# Patient Record
Sex: Female | Born: 1965 | Hispanic: Yes | Marital: Married
Health system: Midwestern US, Community
[De-identification: ages and names within clinical notes are randomized; demographics above are authoritative.]

## PROBLEM LIST (undated history)

## (undated) DIAGNOSIS — D219 Benign neoplasm of connective and other soft tissue, unspecified: Secondary | ICD-10-CM

## (undated) DIAGNOSIS — D259 Leiomyoma of uterus, unspecified: Secondary | ICD-10-CM

---

## 2011-10-28 ENCOUNTER — Encounter

## 2011-10-29 LAB — EKG, 12 LEAD, INITIAL
Atrial Rate: 62 {beats}/min
Calculated P Axis: 14 degrees
Calculated R Axis: 11 degrees
Calculated T Axis: 21 degrees
Diagnosis: NORMAL
P-R Interval: 152 ms
Q-T Interval: 428 ms
QRS Duration: 92 ms
QTC Calculation (Bezet): 434 ms
Ventricular Rate: 62 {beats}/min

## 2011-10-30 LAB — METABOLIC PANEL, BASIC
Anion gap: 10 mmol/L (ref 3.0–18)
BUN/Creatinine ratio: 18 (ref 12–20)
BUN: 15 MG/DL (ref 7.0–18)
CO2: 27 MMOL/L (ref 21–32)
Calcium: 8.9 MG/DL (ref 8.5–10.1)
Chloride: 99 MMOL/L — ABNORMAL LOW (ref 100–108)
Creatinine: 0.83 MG/DL (ref 0.6–1.3)
GFR est AA: >60 mL/min/{1.73_m2} (ref >60)
GFR est non-AA: >60 mL/min/{1.73_m2} (ref >60)
Glucose: 106 MG/DL — ABNORMAL HIGH (ref 74–99)
Potassium: 3.3 MMOL/L — ABNORMAL LOW (ref 3.5–5.5)
Sodium: 136 MMOL/L (ref 136–145)

## 2011-10-30 LAB — CBC W/O DIFF
HCT: 35.1 % (ref 35.0–45.0)
HGB: 11.7 g/dL — ABNORMAL LOW (ref 12.0–16.0)
MCH: 27.8 PG (ref 24.0–34.0)
MCHC: 33.3 g/dL (ref 31.0–37.0)
MCV: 83.4 FL (ref 74.0–97.0)
MPV: 10.4 FL (ref 9.2–11.8)
PLATELET: 280 10*3/uL (ref 135–420)
RBC: 4.21 M/uL (ref 4.20–5.30)
RDW: 13.6 % (ref 11.6–14.5)
WBC: 7.7 10*3/uL (ref 4.6–13.2)

## 2011-10-31 NOTE — H&P (View-Only) (Signed)
[facility])  Requesting Physician:  Melvyn Neth, M.D.     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  She has a history of breast cancer diagnosed at age 43.  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  She ended up having bilateral mastectomy.  She says that she has had fibroids for years and becoming increasingly symptomatic in the past year.  She has had pain since January.  The pain is radiating into her left thigh and left hip.  She has extremely heavy menses and needs to bring a change of clothes to work when she is menstruating.  She has urinary frequency.  No change in bowel habits.  She saw Dr. Hardin Negus on April 19th regarding uterine artery embolization and he felt that the uterus and fibroids were simply too large and recommended hysterectomy.  Pap smear from 2011 was negative.  MRI from April 14th of this year reveals an overall uterine size of 12.6 x 11.0 x 20.  They describe several subserosal fibroids with the largest being exophytic at the right fundus, 9.3cm.  Ultrasound, 07/31/11, reveals a measured uterine length of 14.2cm with multiple fibroids again the largest being pedunculated off the right side at 8.2cm.         PAST MEDICAL HISTORY:     Breast cancer, as above.     PAST SURGICAL HISTORY:     1.  Bilateral mastectomy with TRAM-flap in 2009.  2.  2011 ??? Right elbow surgery.  3.  1994 ??? Cholecystectomy.     FAMILY HISTORY:     Negative for malignancy.     SOCIAL HISTORY:     The patient is single.  She has a 21 year old son.  She works as an Public house manager, as a Psychiatrist, and in a pain Regulatory affairs officer.  She is a nonsmoker with no alcohol intake.       REVIEW OF SYSTEMS:   CONSTITUTIONAL: No fever, chills, anorexia, weight loss, weakness, or sleep disturbance.  CARDIOVASCULAR: No chest pain, palpitations, syncope, or claudication.  RESPIRATORY:  No cough,  shortness of breath, hemoptysis, or orthopnea.  GI:  No nausea, vomiting, frequency or caliber of stools, blood in the stools, or diarrhea.  INTEGUMENTARY (skin, breasts): No breast pain, lumps, nipple discharge, or axillary lumps.  The patient notes no rash or skin irritation systemically.  ENDOCRINE:  No cold intolerance, excessive fatigue, or sleep disturbance.  HEM/LYMPH:  No anemia, easy bruising, history of bleeding disorders, or lymphedema.  GU:  No urinary frequency, dysuria, hematuria, or history of stones.  NEURO:  No numbness or focal weakness.  No tremors.  No problems with voiding or defecation.  PSYCHE:  No symptoms of depression or anxiety.  No hallucinations or symptoms of psychosis.     EXAMINATION:                      CONSTITUTIONAL:  The patient is in no acute distress.  She is alert and oriented times three.  EYES:  No lesions are present.  Pupils are equal.  The sclera are non-icteric.  NECK:  The neck is supple with a normal thyroid.  HEM/LYMPH:  There is no palpable lymphadenopathy in the cervical, supraclavicular, or inguinal regions.  RESPIRATORY:  The chest is clear to auscultation and percussion bilaterally.  CARDIOVASCULAR:  The heart is  regular and without murmur.    GASTROINTESTINAL: Surgical scarring is noted from abdominal reconstruction after TRAM-flap.  The abdomen is soft and nontender.  The uterus is palpable and extends to just below the level of the umbilicus.  It is reasonable mobile.    GENITOURINARY: The external genitalia, vagina, and cervix are without gross lesion.  Bimanual exam reveals an anterior fibroid just above the cervix.  The uterus is again reasonably mobile and extending just below the level of the umbilicus.     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  She has symptomatic uterine fibroids.  She has prior breast reconstruction with TRAM-flaps.  She is not felt to be a candidate for  uterine artery embolization.     PLAN:       I had a frank discussion with the patient regarding the current situation.  I am in agreement that total hysterectomy is in order.  Because of her breast cancer at such an early age, I strongly recommend that she have bilateral salpingo-oophorectomy as well.  After discussion, it seems she is not willing to agree to bilateral salpingo-oophorectomy at this time and I therefore, suggested that she have very close screening of the ovaries in the future.  I also explained that I cannot guarantee that both ovaries can be saved due to technical considerations, but given her wishes, we would make every effort to save one if not both ovaries.  I suggested that we have a reasonable chance of accomplishing the surgery by robotic minimally invasive technique, but that she must be prepared for the possible need for laparotomy either transverse or midline.  The details, risks, and postoperative recovery were thoroughly reviewed.  Questions were answered to her satisfaction, and we have made plans to proceed in June when it is best for her schedule.     cc:       Lawana Chambers. Alvester Morin, M.D.              Margy Clarks, M.D.              Charolotte Capuchin, M.D.                   ADDENDUM:     I also asked the patient to discuss with Dr. Rubye Oaks the possibility of low-dose estrogen replacement until the age of natural menopause if she should lose her ovaries.  This might help her in the decision making process.

## 2011-10-31 NOTE — H&P (Signed)
(Amended)  Requesting Physician:  Donald Bell, M.D.     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  She has a history of breast cancer diagnosed at age 40.  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  She ended up having bilateral mastectomy.  She says that she has had fibroids for years and becoming increasingly symptomatic in the past year.  She has had pain since January.  The pain is radiating into her left thigh and left hip.  She has extremely heavy menses and needs to bring a change of clothes to work when she is menstruating.  She has urinary frequency.  No change in bowel habits.  She saw Dr. Cockrrill on April 19th regarding uterine artery embolization and he felt that the uterus and fibroids were simply too large and recommended hysterectomy.  Pap smear from 2011 was negative.  MRI from April 14th of this year reveals an overall uterine size of 12.6 x 11.0 x 20.  They describe several subserosal fibroids with the largest being exophytic at the right fundus, 9.3cm.  Ultrasound, 07/31/11, reveals a measured uterine length of 14.2cm with multiple fibroids again the largest being pedunculated off the right side at 8.2cm.         PAST MEDICAL HISTORY:     Breast cancer, as above.     PAST SURGICAL HISTORY:     1.  Bilateral mastectomy with TRAM-flap in 2009.  2.  2011 ??? Right elbow surgery.  3.  1994 ??? Cholecystectomy.     FAMILY HISTORY:     Negative for malignancy.     SOCIAL HISTORY:     The patient is single.  She has a 21-year-old son.  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  She is a nonsmoker with no alcohol intake.       REVIEW OF SYSTEMS:   CONSTITUTIONAL: No fever, chills, anorexia, weight loss, weakness, or sleep disturbance.  CARDIOVASCULAR: No chest pain, palpitations, syncope, or claudication.  RESPIRATORY:  No cough,  shortness of breath, hemoptysis, or orthopnea.  GI:  No nausea, vomiting, frequency or caliber of stools, blood in the stools, or diarrhea.  INTEGUMENTARY (skin, breasts): No breast pain, lumps, nipple discharge, or axillary lumps.  The patient notes no rash or skin irritation systemically.  ENDOCRINE:  No cold intolerance, excessive fatigue, or sleep disturbance.  HEM/LYMPH:  No anemia, easy bruising, history of bleeding disorders, or lymphedema.  GU:  No urinary frequency, dysuria, hematuria, or history of stones.  NEURO:  No numbness or focal weakness.  No tremors.  No problems with voiding or defecation.  PSYCHE:  No symptoms of depression or anxiety.  No hallucinations or symptoms of psychosis.     EXAMINATION:                      CONSTITUTIONAL:  The patient is in no acute distress.  She is alert and oriented times three.  EYES:  No lesions are present.  Pupils are equal.  The sclera are non-icteric.  NECK:  The neck is supple with a normal thyroid.  HEM/LYMPH:  There is no palpable lymphadenopathy in the cervical, supraclavicular, or inguinal regions.  RESPIRATORY:  The chest is clear to auscultation and percussion bilaterally.  CARDIOVASCULAR:  The heart is   regular and without murmur.    GASTROINTESTINAL: Surgical scarring is noted from abdominal reconstruction after TRAM-flap.  The abdomen is soft and nontender.  The uterus is palpable and extends to just below the level of the umbilicus.  It is reasonable mobile.    GENITOURINARY: The external genitalia, vagina, and cervix are without gross lesion.  Bimanual exam reveals an anterior fibroid just above the cervix.  The uterus is again reasonably mobile and extending just below the level of the umbilicus.     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  She has symptomatic uterine fibroids.  She has prior breast reconstruction with TRAM-flaps.  She is not felt to be a candidate for  uterine artery embolization.     PLAN:       I had a frank discussion with the patient regarding the current situation.  I am in agreement that total hysterectomy is in order.  Because of her breast cancer at such an early age, I strongly recommend that she have bilateral salpingo-oophorectomy as well.  After discussion, it seems she is not willing to agree to bilateral salpingo-oophorectomy at this time and I therefore, suggested that she have very close screening of the ovaries in the future.  I also explained that I cannot guarantee that both ovaries can be saved due to technical considerations, but given her wishes, we would make every effort to save one if not both ovaries.  I suggested that we have a reasonable chance of accomplishing the surgery by robotic minimally invasive technique, but that she must be prepared for the possible need for laparotomy either transverse or midline.  The details, risks, and postoperative recovery were thoroughly reviewed.  Questions were answered to her satisfaction, and we have made plans to proceed in June when it is best for her schedule.     cc:       Donald B. Bell, M.D.              Elizabeth Lanoue, M.D.              Amy Skorupa, M.D.                   ADDENDUM:     I also asked the patient to discuss with Dr. Skorupa the possibility of low-dose estrogen replacement until the age of natural menopause if she should lose her ovaries.  This might help her in the decision making process.

## 2011-11-04 NOTE — Addendum Note (Signed)
Addended by: Vanessa Ralphs on: 11/04/2011 05:54 PM     Modules accepted: Orders

## 2011-11-06 ENCOUNTER — Inpatient Hospital Stay: Payer: PRIVATE HEALTH INSURANCE

## 2011-11-06 MED ADMIN — cefoTEtan (CEFOTAN) 2 g in 0.9% sodium chloride (MBP/ADV) 50 mL MBP: INTRAVENOUS | @ 12:00:00 | NDC 63323038620

## 2011-11-06 MED ADMIN — lactated ringers 1,000 mL Irrigation: @ 13:00:00 | NDC 00409795309

## 2011-11-06 MED ADMIN — morphine injection 1-2 mg: INTRAVENOUS | @ 18:00:00 | NDC 00409189001

## 2011-11-06 MED ADMIN — morphine injection 1-2 mg: INTRAVENOUS | @ 22:00:00 | NDC 00409189001

## 2011-11-06 MED ADMIN — sodium chloride (NS) flush 5-10 mL: INTRAVENOUS | @ 18:00:00 | NDC 87701099893

## 2011-11-06 MED ADMIN — morphine injection 1-2 mg: INTRAVENOUS | @ 20:00:00 | NDC 00409189001

## 2011-11-06 MED ADMIN — acetaminophen (OFIRMEV) infusion 1,000 mg: INTRAVENOUS | @ 11:00:00 | NDC 43825010201

## 2011-11-06 MED ADMIN — HYDROmorphone (DILAUDID) injection 0.5 mg: INTRAVENOUS | @ 15:00:00 | NDC 00641012121

## 2011-11-06 MED ADMIN — dextrose 5% - 0.45% NaCl with KCl 20 mEq/L infusion: INTRAVENOUS | @ 19:00:00 | NDC 00409790209

## 2011-11-06 MED ADMIN — famotidine (PEPCID) tablet 20 mg: ORAL | @ 11:00:00 | NDC 68084017211

## 2011-11-06 MED ADMIN — enoxaparin (LOVENOX) injection 40 mg: SUBCUTANEOUS | @ 16:00:00 | NDC 00075062040

## 2011-11-06 MED ADMIN — ketorolac (TORADOL) injection 30 mg: INTRAVENOUS | @ 17:00:00 | NDC 00409379501

## 2011-11-06 MED ADMIN — lactated ringers infusion: INTRAVENOUS | @ 11:00:00 | NDC 00409795309

## 2011-11-06 MED ADMIN — bupivacaine (PF) (MARCAINE) 0.5 % (5 mg/mL) injection: SUBCUTANEOUS | @ 12:00:00 | NDC 00409116202

## 2011-11-06 MED ADMIN — lactated ringers infusion: INTRAVENOUS | @ 17:00:00 | NDC 00409795309

## 2011-11-06 MED ADMIN — 0.9% sodium chloride 1,000 mL Irrigation: @ 13:00:00 | NDC 00338004904

## 2011-11-06 MED FILL — LACTATED RINGERS IV: INTRAVENOUS | Qty: 1000

## 2011-11-06 MED FILL — BD POSIFLUSH NORMAL SALINE 0.9 % INJECTION SYRINGE: INTRAMUSCULAR | Qty: 10

## 2011-11-06 MED FILL — MORPHINE 2 MG/ML INJECTION: 2 mg/mL | INTRAMUSCULAR | Qty: 1

## 2011-11-06 MED FILL — BUPIVACAINE (PF) 0.5 % (5 MG/ML) IJ SOLN: 0.5 % (5 mg/mL) | INTRAMUSCULAR | Qty: 30

## 2011-11-06 MED FILL — HYDROMORPHONE 2 MG/ML INJECTION SOLUTION: 2 mg/mL | INTRAMUSCULAR | Qty: 1

## 2011-11-06 MED FILL — FAMOTIDINE 20 MG TAB: 20 mg | ORAL | Qty: 1

## 2011-11-06 MED FILL — OFIRMEV 1,000 MG/100 ML (10 MG/ML) INTRAVENOUS SOLUTION: 1000 mg/100 mL (10 mg/mL) | INTRAVENOUS | Qty: 100

## 2011-11-06 MED FILL — CEFOTETAN 2 GRAM SOLUTION FOR INJECTION: 2 gram | INTRAMUSCULAR | Qty: 2

## 2011-11-06 MED FILL — D5-1/2 NS & POTASSIUM CHLORIDE 20 MEQ/L IV: 20 mEq/L | INTRAVENOUS | Qty: 1000

## 2011-11-06 MED FILL — LOVENOX 40 MG/0.4 ML SUBCUTANEOUS SYRINGE: 40 mg/0.4 mL | SUBCUTANEOUS | Qty: 0.4

## 2011-11-06 MED FILL — KETOROLAC TROMETHAMINE 30 MG/ML INJECTION: 30 mg/mL (1 mL) | INTRAMUSCULAR | Qty: 1

## 2011-11-06 MED FILL — SCOPOLAMINE (1.3-1.5) MG 72 HR TRANSDERM PATCH: 1 mg over 3 days | TRANSDERMAL | Qty: 1

## 2011-11-06 MED FILL — MIDAZOLAM 1 MG/ML IJ SOLN: 1 mg/mL | INTRAMUSCULAR | Qty: 2

## 2011-11-06 NOTE — Op Note (Signed)
OPERATION    DATE OF OPERATION:  11/06/2011      SURGEON: Brendalyn Vallely C Efrata Brunner, MD, MD    ASSISTANT: Roger Wright, SA      PREOPERATIVE DIAGNOSIS:  fibroids      POSTOPERATIVE DIAGNOSIS: fibroids, 24 cm uterus, 1200grams  .    OPERATION PERFORMED:  1. Robotic hysterectomy, >250 grams, excess difficulty secondary to massive uterine size.    INDICATIONS:  Amber Benson is a 45 y.o. old female admitted for surgery with a history of:   There are no active problems to display for this patient.  .  Recommendations were made for a definitive surgical therapy.  The patient was advised of potential risks and complications and written  consent was obtained.    ANESTHESIA: General.    COMPLICATIONS: none    EBL: 100    FINDINGS: 24cm fibroid uterus. Normal ovaries. No pathology in the upper abdomen.    DESCRIPTION OF OPERATION:     TECHNIQUE: After the patient was identified in the operating room, she was   placed under general anesthesia by the anesthesia team. She was sterilely   prepped and draped in the modified lithotomy position. The cervix and   uterus were secured with a VCare. An incision was made in the midline of  the upper abdomen and a 10 mm Optiview trocar was inserted. A   pneumoperitoneum was created. The 8 mm robotic ports were placed under   direct vision in the right and left upper abdomen and the left mid axillary line, and an accessory port   was placed in the right lower quadrant. The patient was placed in Trendelenburg position and the robot was docked. The ureters were identified. A 10 - 12 cm pedunculated fibroid was removed from the right fundus with bipolar cautery, and placed in the right para-colic gutter. The utero-ovarian anastomoses were skeletonized, coagulated, and divided. The broad ligaments were divided with   electrocautery. The round ligaments were coagulated and divided. The   bladder was sharply dissected from the cervix and upper vagina. The uterine   vessels were skeletonized,  thoroughly coagulated, and divided. The right uterine artery was also secured with an endoloop because of its size. The cardinal   ligaments were divided with electrocautery and electrocautery was used to   circumscribe the colpotomizer cup. The initial fibroid was removed through the RLQ site using a morcellator. The specimen was morcellated and removed through the   vagina. The vaginal   apex was closed with running 0 V-Loc. The pelvis was copiously irrigated   and hemostasis was noted to be excellent. There was absolutely no active   bleeding. The right lower quadrant site was closed with 0 Vicryl suture   using the Carter-Thomason device. The robotic instruments were removed   under direct vision and the robot was undocked. The pneumoperitoneum was   deflated, and the ports were removed. The skin incisions were closed with   subcuticular 4-0 Monocryl and Dermabond was applied. The patient was then   awakened, extubated, and transported to the recovery room in stable   condition. All needle, sponge, and instrument counts were noted to be   correct at the end of the case.        ______________________________  SIGNED: Jolyne Laye C Deloy Archey, MD, MD

## 2011-11-06 NOTE — Other (Signed)
Bedside and Verbal shift change report given to Mary Beth Acierto, RN (oncoming nurse) by Christina E Phillips, RN  (offgoing nurse).  Report given with SBAR, Kardex, MAR and Recent Results.

## 2011-11-06 NOTE — Other (Signed)
Pt. Awake and alert.  Denies nausea,  Tolerating ice chips.  VSS.   Repositioned for comfort.

## 2011-11-06 NOTE — Progress Notes (Signed)
Post-Anesthesia Evaluation and Assessment    Patient: Amber Benson MRN: [id number]  SSN: [id number]    Date of Birth: 30-Dec-1965  Age: 46 y.o.  Sex: female       Cardiovascular Function/Vital Signs  Visit Vitals   Item Reading   ??? BP 124/73   ??? Pulse 73   ??? Temp 98 ??F (36.7 ??C)   ??? Resp 14   ??? Ht 5\' 4"  (1.626 m)   ??? Wt 73.029 kg (161 lb)   ??? BMI 27.64 kg/m2   ??? SpO2 98%       Patient is status post General anesthesia for Procedure(s):  davinci total laparoscopic HYSTERECTOMY  with morcellator,possible exploratory laparotomy ROBOTIC ASSISTED .    Nausea/Vomiting: None    Postoperative hydration reviewed and adequate.    Pain:  Pain Scale 1: Numeric (0 - 10) (11/06/11 1246)  Pain Intensity 1: 8 (11/06/11 1246)   Managed    Neurological Status:   Neuro (WDL): Within Defined Limits (11/06/11 1308)   At baseline    Mental Status and Level of Consciousness: Alert and oriented to person, place, and time    Pulmonary Status:   O2 Device: Nasal cannula (11/06/11 1308)   Adequate oxygenation and airway patent    Complications related to anesthesia: None    Post-anesthesia assessment completed. No concerns    Signed By: Billy Fischer, MD     November 06, 2011

## 2011-11-06 NOTE — Brief Op Note (Signed)
BRIEF OPERATIVE NOTE    Date of Procedure: 11/06/2011   Preoperative Diagnosis: fibroids  Postoperative Diagnosis: fibroids    Procedure: Procedure(s):  davinci total laparoscopic HYSTERECTOMY  with morcellator,possible exploratory laparotomy ROBOTIC ASSISTED   Surgeon(s) and Role:     * Vanessa Ralphs, MD - Primary  Anesthesia: General   Estimated Blood Loss: 100  Specimens:   ID Type Source Tests Collected by Time Destination   1 : UTERUS AND CERVIX Preservative Uterus  Vanessa Ralphs, MD 11/06/2011 [phone] Pathology      Findings: 24cm fibroid uterus. Normal ovaries   Complications: none  Implants: * No implants in log *

## 2011-11-06 NOTE — Progress Notes (Addendum)
Assessed pt.  Resting quietly in bed.  Pt c/o abdominal pain; prn pain medication given.  Pt denies nausea.  Pt c/o of numbness in both hands; pt has strong hand grips.  Will continue to monitor numbness.  No s/s of acute distress.  Pt oriented to room.  Safety assessed; call bell within reach.  Spouse at the bedside.  Will continue to monitor pt.  Dell Ponto, RN     11/06/2011; 3:26 PM - Pt still c/o numbness in hands.  Some improvement noted.  Will continue to monitor.  Dell Ponto, RN     11/06/2011; 5:45 PM - Pt c/o numbness in hands; slowly/progressively improving.  Pt reports numbness only in the fingertips of the right hand; left hand still continues to be numb.  Will continue to monitor.  Dell Ponto, RN

## 2011-11-06 NOTE — Interval H&P Note (Signed)
H&P Update:  Camreigh Michie was seen and examined.  History and physical has been reviewed. There have been no significant clinical changes since the completion of the originally dated History and Physical. I have recommended BSO. Dr Rubye Oaks advised ovarian preservation and that is what Anyelina wants. We will preserve the ovaries if technically feasible    Signed By: Vanessa Ralphs, MD     November 06, 2011 7:19 AM

## 2011-11-06 NOTE — Other (Signed)
Sleeping.  Arouses easily to name called.  States abdominal pain 9/10.  Offered pain med but refuses.

## 2011-11-06 NOTE — Other (Signed)
Patient received from OR via stretcher. Supine. HOB up 20 degrees.  Spontaneous respirations with good effort.  VSS. Patient sleepy.  Arouses easily to name called.  Denies any pain.  Allergies reviewed with CRNA and chart.  No respiratory distress.

## 2011-11-06 NOTE — Other (Signed)
TRANSFER - OUT REPORT:    Verbal report given to Shireen Quan, RN(name) on Loreal Schuessler  being transferred to 2204(unit) for routine progression of care       Report consisted of patient???s Situation, Background, Assessment and   Recommendations(SBAR).     Information from the following report(s) SBAR, Kardex, OR Summary, Intake/Output, MAR and Recent Results was reviewed with the receiving nurse.    Opportunity for questions and clarification was provided.

## 2011-11-06 NOTE — Op Note (Signed)
OPERATION    DATE OF OPERATION:  11/06/2011      SURGEON: Vanessa Ralphs, MD, MD    ASSISTANT: Parthenia Ames, SA      PREOPERATIVE DIAGNOSIS:  fibroids      POSTOPERATIVE DIAGNOSIS: fibroids, 24 cm uterus, 1200grams  .    OPERATION PERFORMED:  1. Robotic hysterectomy, >250 grams, excess difficulty secondary to massive uterine size.    INDICATIONS:  Amber Benson is a 46 y.o. old female admitted for surgery with a history of:   There are no active problems to display for this patient.  .  Recommendations were made for a definitive surgical therapy.  The patient was advised of potential risks and complications and written  consent was obtained.    ANESTHESIA: General.    COMPLICATIONS: none    EBL: 100    FINDINGS: 24cm fibroid uterus. Normal ovaries. No pathology in the upper abdomen.    DESCRIPTION OF OPERATION:     TECHNIQUE: After the patient was identified in the operating room, she was   placed under general anesthesia by the anesthesia team. She was sterilely   prepped and draped in the modified lithotomy position. The cervix and   uterus were secured with a VCare. An incision was made in the midline of  the upper abdomen and a 10 mm Optiview trocar was inserted. A   pneumoperitoneum was created. The 8 mm robotic ports were placed under   direct vision in the right and left upper abdomen and the left mid axillary line, and an accessory port   was placed in the right lower quadrant. The patient was placed in Trendelenburg position and the robot was docked. The ureters were identified. A 10 - 12 cm pedunculated fibroid was removed from the right fundus with bipolar cautery, and placed in the right para-colic gutter. The utero-ovarian anastomoses were skeletonized, coagulated, and divided. The broad ligaments were divided with   electrocautery. The round ligaments were coagulated and divided. The   bladder was sharply dissected from the cervix and upper vagina. The uterine   vessels were skeletonized,  thoroughly coagulated, and divided. The right uterine artery was also secured with an endoloop because of its size. The cardinal   ligaments were divided with electrocautery and electrocautery was used to   circumscribe the colpotomizer cup. The initial fibroid was removed through the RLQ site using a morcellator. The specimen was morcellated and removed through the   vagina. The vaginal   apex was closed with running 0 V-Loc. The pelvis was copiously irrigated   and hemostasis was noted to be excellent. There was absolutely no active   bleeding. The right lower quadrant site was closed with 0 Vicryl suture   using the Carter-Thomason device. The robotic instruments were removed   under direct vision and the robot was undocked. The pneumoperitoneum was   deflated, and the ports were removed. The skin incisions were closed with   subcuticular 4-0 Monocryl and Dermabond was applied. The patient was then   awakened, extubated, and transported to the recovery room in stable   condition. All needle, sponge, and instrument counts were noted to be   correct at the end of the case.        ______________________________  SIGNED: Vanessa Ralphs, MD, MD

## 2011-11-07 LAB — CBC W/O DIFF
HCT: 32.2 % — ABNORMAL LOW (ref 35.0–45.0)
HGB: 11 g/dL — ABNORMAL LOW (ref 12.0–16.0)
MCH: 28.4 PG (ref 24.0–34.0)
MCHC: 34.2 g/dL (ref 31.0–37.0)
MCV: 83 FL (ref 74.0–97.0)
MPV: 10.2 FL (ref 9.2–11.8)
PLATELET: 225 10*3/uL (ref 135–420)
RBC: 3.88 M/uL — ABNORMAL LOW (ref 4.20–5.30)
RDW: 13.8 % (ref 11.6–14.5)
WBC: 13.7 10*3/uL — ABNORMAL HIGH (ref 4.6–13.2)

## 2011-11-07 LAB — METABOLIC PANEL, BASIC
Anion gap: 11 mmol/L (ref 3.0–18)
BUN/Creatinine ratio: 8 — ABNORMAL LOW (ref 12–20)
BUN: 7 MG/DL (ref 7.0–18)
CO2: 22 MMOL/L (ref 21–32)
Calcium: 7.8 MG/DL — ABNORMAL LOW (ref 8.5–10.1)
Chloride: 105 MMOL/L (ref 100–108)
Creatinine: 0.84 MG/DL (ref 0.6–1.3)
GFR est AA: >60 mL/min/{1.73_m2} (ref >60)
GFR est non-AA: >60 mL/min/{1.73_m2} (ref >60)
Glucose: 154 MG/DL — ABNORMAL HIGH (ref 74–99)
Potassium: 3.8 MMOL/L (ref 3.5–5.5)
Sodium: 138 MMOL/L (ref 136–145)

## 2011-11-07 MED ORDER — OXYCODONE-ACETAMINOPHEN 5 MG-325 MG TAB
5-325 mg | ORAL_TABLET | ORAL | Status: AC | PRN
Start: 2011-11-07 — End: ?

## 2011-11-07 MED ORDER — DOCUSATE SODIUM 100 MG CAP
100 mg | ORAL_CAPSULE | Freq: Two times a day (BID) | ORAL | Status: AC | PRN
Start: 2011-11-07 — End: 2011-11-21

## 2011-11-07 MED ORDER — PROMETHAZINE 25 MG TAB
25 mg | ORAL_TABLET | Freq: Four times a day (QID) | ORAL | Status: AC | PRN
Start: 2011-11-07 — End: ?

## 2011-11-07 MED ADMIN — ondansetron (ZOFRAN) injection 4 mg: INTRAVENOUS | @ 01:00:00 | NDC 00781301072

## 2011-11-07 MED ADMIN — morphine injection 1-2 mg: INTRAVENOUS | @ 01:00:00 | NDC 00409189001

## 2011-11-07 MED ADMIN — oxyCODONE-acetaminophen (PERCOCET) 5-325 mg per tablet 1-2 Tab: ORAL | @ 16:00:00 | NDC 00406051223

## 2011-11-07 MED ADMIN — dextrose 5% - 0.45% NaCl with KCl 20 mEq/L infusion: INTRAVENOUS | @ 10:00:00 | NDC 00409790209

## 2011-11-07 MED ADMIN — morphine injection 1-2 mg: INTRAVENOUS | @ 08:00:00 | NDC 00409189001

## 2011-11-07 MED ADMIN — enoxaparin (LOVENOX) injection 40 mg: SUBCUTANEOUS | @ 16:00:00 | NDC 00075062040

## 2011-11-07 MED ADMIN — promethazine (PHENERGAN) injection 12.5 mg: INTRAVENOUS | @ 01:00:00 | NDC 00641092821

## 2011-11-07 MED ADMIN — sodium chloride (NS) flush 5-10 mL: INTRAVENOUS | @ 03:00:00 | NDC 87701099893

## 2011-11-07 MED ADMIN — morphine injection 1-2 mg: INTRAVENOUS | @ 03:00:00 | NDC 00409189001

## 2011-11-07 MED ADMIN — dextrose 5% - 0.45% NaCl with KCl 20 mEq/L infusion: INTRAVENOUS | @ 03:00:00 | NDC 00409790209

## 2011-11-07 MED ADMIN — oxyCODONE-acetaminophen (PERCOCET) 5-325 mg per tablet 1-2 Tab: ORAL | @ 12:00:00 | NDC 00406051223

## 2011-11-07 MED FILL — MORPHINE 2 MG/ML INJECTION: 2 mg/mL | INTRAMUSCULAR | Qty: 1

## 2011-11-07 MED FILL — D5-1/2 NS & POTASSIUM CHLORIDE 20 MEQ/L IV: 20 mEq/L | INTRAVENOUS | Qty: 1000

## 2011-11-07 MED FILL — OXYCODONE-ACETAMINOPHEN 5 MG-325 MG TAB: 5-325 mg | ORAL | Qty: 2

## 2011-11-07 MED FILL — PROMETHAZINE 25 MG/ML INJECTION: 25 mg/mL | INTRAMUSCULAR | Qty: 1

## 2011-11-07 MED FILL — VECURONIUM BROMIDE 10 MG IV SOLR: 10 mg | INTRAVENOUS | Qty: 1

## 2011-11-07 MED FILL — NEOSTIGMINE METHYLSULFATE 1 MG/ML INJECTION: 1 mg/mL | INTRAMUSCULAR | Qty: 10

## 2011-11-07 MED FILL — LIDOCAINE 2 % MUCOSAL GEL: 2 % | Qty: 5

## 2011-11-07 MED FILL — LOVENOX 40 MG/0.4 ML SUBCUTANEOUS SYRINGE: 40 mg/0.4 mL | SUBCUTANEOUS | Qty: 0.4

## 2011-11-07 MED FILL — CETIRIZINE 10 MG TAB: 10 mg | ORAL | Qty: 1

## 2011-11-07 MED FILL — ONDANSETRON (PF) 4 MG/2 ML INJECTION: 4 mg/2 mL | INTRAMUSCULAR | Qty: 2

## 2011-11-07 MED FILL — GLYCOPYRROLATE 0.2 MG/ML IJ SOLN: 0.2 mg/mL | INTRAMUSCULAR | Qty: 2

## 2011-11-07 MED FILL — DIPRIVAN 10 MG/ML INTRAVENOUS EMULSION: 10 mg/mL | INTRAVENOUS | Qty: 20

## 2011-11-07 NOTE — Progress Notes (Signed)
Location: [facility] - [id number]  Attn.: Norris Cross C  DOB: August 20, 1965 / Age: 46  MR#: [id number] / Admit#: [id number]  Pt. First Name: Amber  Pt. Last Name: Benson Benson [place], [place]  [address]  [address]  [facility]                        Case Management - Progress Note  Initial Open Date: 11/07/2011  Case Manager: Leana Gamer    Initial Open Date:  Social Worker:  Expected Date of Discharge:  Transferred From:  EC[facility] Bed Held Until:  Bed Held By:  Power of Attorney:  POA/Guardian/Conservator Capacity:  Primary Caregiver:  Living Arrangements: Own Home  Source of Income:  Payee:  Psychosocial History:  Cultural/Religious/Language Issues:  Education Level:  ADLS/Current Living Arrangements Issues:  Past Providers:  Will patient perform self care at discharge? U  Anticipated Discharge Disposition Goal: Return to admission address  Assessment/Plan:      11/07/2011 10:37A In room and spoke with patient. Plan is  home with fiance at discharge. Fiance will transport and assist.  JClerveau,RN  Resources at Discharge:  Service Providers at Discharge:  Dictating Provider:

## 2011-11-07 NOTE — Progress Notes (Signed)
Chaplain conducted an initial consultation and Spiritual Assessment for Amber Benson, who is a 46 y.o.,female. Patient???s Primary Language is: [place].   According to the patient???s EMR Religious Affiliation is: Catholic.     The reason the Patient came to the hospital is: There are no active problems to display for this patient.       The Chaplain provided the following Interventions:  Initiated a relationship of care and support.   Explored issues of faith, belief, spirituality and religious/ritual needs while hospitalized.  Listened empathically.  Provided chaplaincy education.  Provided information about Spiritual Care Services.  Offered prayer and assurance of continued prayers on patients behalf.   Chart reviewed.    The following outcomes where achieved:  Patient shared limited information about both their medical narrative and spiritual journey/beliefs.  Chaplain confirmed Patient's Religious Affiliation.  Patient processed feeling about current hospitalization.  Patient expressed gratitude for pastoral care visit.    Assessment:  Patient does not have any religious/cultural needs that will affect patient???s preferences in health care.  There are no spiritual or religious issue which require intervention at this time.     Plan:  Chaplains will continue to follow and will provide pastoral care on an as needed/requested basis.  Chaplain recommends bedside caregivers page chaplain on duty if patient shows signs of acute spiritual or emotional distress.       Chaplain Briscoe Deutscher, MDiv,   Board Certified Chaplain  [phone] - Office

## 2011-11-07 NOTE — Other (Signed)
Bedside and Verbal shift change report given to Cassie Gardiner RN (oncoming nurse) by MARY E ACIERTO, RN   (offgoing nurse).  Report given with SBAR, Kardex and MAR.

## 2011-11-07 NOTE — Progress Notes (Signed)
Problem: Discharge Planning  Goal: *Discharge to safe environment  Plan home with fiance. Fiance supportive and will help and assist. JClerveau,RN

## 2011-11-07 NOTE — Progress Notes (Signed)
Gynecologic Oncology Daily Progress Note    Amber Benson is POD# 1 from a Robotic hysterectomy,    Admit Date/Surgery Date: 11/06/2011    Subjective:  Patient reports shoulder pain and numbness in hands. We discussed numbness may be related to the positioning, she notes it to be improving since initially after surgery yesterday. She notes she is having to have the IV pain medication as she was nauseated she did not try oral medications. She has foley in place and has not been up or ambulated as she felt dizzy.      Patient does not feels ready for discharge this AM but thinks she may be able to this afternoon.    Objective:  Patient Vitals for the past 12 hrs:   Temp Pulse Resp BP SpO2   11/07/11 0532 99.2 ??F (37.3 ??C) 78  16  123/76 mmHg 95 %   11/07/11 0018 99.4 ??F (37.4 ??C) 85  16  113/68 mmHg 95 %   11/06/11 1926 97.9 ??F (36.6 ??C) 79  16  124/80 mmHg 95 %       Gen: A&O in NAD  CV: RRR  Resp: CTAB  Abd: NTND, + BS, Incision site is intact without signs of infection.  Ext: no edema, no swelling; warm and well perfused UE with good movement, normal pulses, no obvious source of any injury    Intake/Output: NR/570  Medications:  Current Facility-Administered Medications   Medication Dose Route Frequency   ??? famotidine (PEPCID) tablet 20 mg  20 mg Oral ONCE   ??? acetaminophen (OFIRMEV) infusion 1,000 mg  1,000 mg IntraVENous ONCE   ??? scopolamine (TRANSDERM-SCOP) 1.5 mg       ??? cetirizine (ZYRTEC) tablet 10 mg  10 mg Oral DAILY   ??? dextrose 5% - 0.45% NaCl with KCl 20 mEq/L infusion  125 mL/hr IntraVENous CONTINUOUS   ??? sodium chloride (NS) flush 5-10 mL  5-10 mL IntraVENous Q8H   ??? sodium chloride (NS) flush 5-10 mL  5-10 mL IntraVENous PRN   ??? acetaminophen (TYLENOL) solution 650 mg  650 mg Oral Q4H PRN   ??? oxyCODONE-acetaminophen (PERCOCET) 5-325 mg per tablet 1-2 Tab  1-2 Tab Oral Q4H PRN   ??? morphine injection 1-2 mg  1-2 mg IntraVENous Q2H PRN   ??? naloxone (NARCAN) injection 0.4 mg  0.4 mg IntraVENous PRN   ???  ondansetron (ZOFRAN) injection 4 mg  4 mg IntraVENous Q8H PRN   ??? promethazine (PHENERGAN) injection 12.5 mg  12.5 mg IntraVENous Q6H PRN   ??? diphenhydrAMINE (BENADRYL) capsule 25 mg  25 mg Oral Q4H PRN   ??? zolpidem (AMBIEN) tablet 5 mg  5 mg Oral QHS PRN   ??? enoxaparin (LOVENOX) injection 40 mg  40 mg SubCUTAneous Q24H   ??? enoxaparin (LOVENOX) 40 mg/0.4 mL injection       ??? ketorolac (TORADOL) injection 30 mg  30 mg IntraVENous NOW   ??? ketorolac (TORADOL) 30 mg/mL (1 mL) injection           Lab Results:  Recent Results (from the past 24 hour(s))   METABOLIC PANEL, BASIC    Collection Time    11/07/11  4:30 AM       Component Value Range    Sodium 138  136 - 145 MMOL/L    Potassium 3.8  3.5 - 5.5 MMOL/L    Chloride 105  100 - 108 MMOL/L    CO2 22  21 - 32 MMOL/L  Anion gap 11  3.0 - 18 mmol/L    Glucose 154 (*) 74 - 99 MG/DL    BUN 7  7.0 - 18 MG/DL    Creatinine 2.13  0.6 - 1.3 MG/DL    BUN/Creatinine ratio 8 (*) 12 - 20      GFR est-AA >60  >60 ml/min/1.28m2    GFR est non-AA >60  >60 ml/min/1.68m2    Calcium 7.8 (*) 8.5 - 10.1 MG/DL   CBC W/O DIFF    Collection Time    11/07/11  4:30 AM       Component Value Range    WBC 13.7 (*) 4.6 - 13.2 K/uL    RBC 3.88 (*) 4.20 - 5.30 M/uL    HGB 11.0 (*) 12.0 - 16.0 g/dL    HCT 08.6 (*) 57.8 - 45.0 %    MCV 83.0  74.0 - 97.0 FL    MCH 28.4  24.0 - 34.0 PG    MCHC 34.2  31.0 - 37.0 g/dL    RDW 46.9  62.9 - 52.8 %    PLATELET 225  135 - 420 K/uL    MPV 10.2  9.2 - 11.8 FL         A/P:  Amber Benson is a 46 y.o. F on POD # 1 from robotic hysterectomy  Doing well   Pain controlled with IV but has no tried oral medications  Postoperative numbness in hands-likely due to positioning.   Ambulating without difficulty   Labs are stable    Plan to:  Advance diet  Encourage oral medications this AM  Likely d/c home later today        Juanell Fairly  [facility] ObGyn PGY 2   Pager: [phone]

## 2011-11-07 NOTE — Other (Signed)
0745-Alerted to patient room by family member, patient sitting on toilet frank blood noted in hat estimated 50-100 ml of blood noted. Patient free from s/s of distress, no complaints of dizziness or lightheadedness. Patient assisted back to bed, and after vaginal exam, no obvious signs of trauma noted. Dr Izola Price paged.  61- Received return call from Dr Izola Price, she advised to continue to monitor d/t possibility of residual blood left in vagina after surgery, and to call if further bleeding continues.

## 2011-11-08 LAB — TYPE + CROSSMATCH
ABO/Rh(D): O POS
Antibody screen: NEGATIVE
Unit division: 0
Unit division: 0

## 2013-09-22 NOTE — Progress Notes (Signed)
[facility] [place] [address]- Phone: [phone]  Fax: [phone]   Nutrition Assessment ??? Medical Nutrition Therapy   Outpatient  Initial Evaluation         Patient Name: Amber Benson DOB: Mar 25, 1966   Treatment Diagnosis: High chol. Onset Date:    Referral Source: Kerry Hough, MD Start of Care [facility]): 09/22/2013     Ht:  65 in  cm Wt: 164.4 lb  kg   BMI: 27.3  BMR female    BMR female 1385     Recent Weight Change: n/a Total Weight Change:       Medications of Nutritional Significance:   Fish oil     Current Diet Consists Of: BF: Steel cut oatmeal with 1 Tb honey, cinnamon, fruit or nuts  L: Salmon, chicken, or beans (leftover dinner)  s- chips and salsa  D: grilled chicken or fish with vegetables with qunioa/brown rice    Drinks primarily water, occ unsweet tea or soda.     Subjective/Assessment: Pt in today with high cholesterol.  She has discussed dietary interventions with her PCP and has already made many very positive changes.  Her diet is currently very healthy, but she has not been exercising lately due to injury.  She is slowly getting back into it, but believes this has been a factor in higher cholesterol.  Praised pt for positive changes made and encouraged her to continue.  Pt also struggles with constipation.  She is eating whole grains, fruit, and vegetables but encouraged an increase in water to at least 80oz per day.  She had recently begun a fiber supplement, but lack of water was worsening symptoms.     Handouts/  Information Provided: []   Carbohydrates  []   Protein  [x]   Fiber  []   Serving Sizes  []   Fluids  [x]   General Guidelines []   Diabetes  [x]   Cholesterol  []   Sodium  []   Eating Out  []   Smoothies  []   Others:    Estimate Needs:   Calories: 1600 Protein: 120 Carbs: 160 Fat: 53   Kcal/day  g/day  g/day  g/day         percent: 30 percent: 40 percent: 30     Nutritional Goal - To promote lifestyle changes to result  in:    []   weight loss  []   Improved diabetic control  [x]   Decreased cholesterol levels  []   Decreased blood pressure  []   weight maintenance []   Preventing any interactions associated with food allergies  []   Adequate weight gain toward goal weight  []   Other:          Recommendations: Exercise on bike 30 minutes at least 4 days per week.  Use Citrucel powder daily.  Drinks at least 80oz water daily.  Continue using whole grains, healthy fats, and a variety of fruits and vegetables.     Information Reviewed with: Pt   Potential Barriers to Learning: none     Dietitian Signature: Dub Amis, MS, RD Date: 09/22/2013   Follow-up: prn Time: 4:22 PM

## 2020-07-19 IMAGING — MR MRI WRIST RT WO CONTRAST
6 of 7 series · 37 of 40 positions shown · non-contrast
Comparison: None.

INDICATION: Wrist sprain and swelling.
TECHNIQUE: Multiplanar, multiecho imaging of the right wrist was performed, including T1-weighted and fluid sensitive sequences without intravenous contrast administration.

[Series 4: t1_axial · axial · right · 3.0mm · 0.29mm/px · z∈[-14,+41]mm · 5 of 20 slices shown]
[im 1/20]
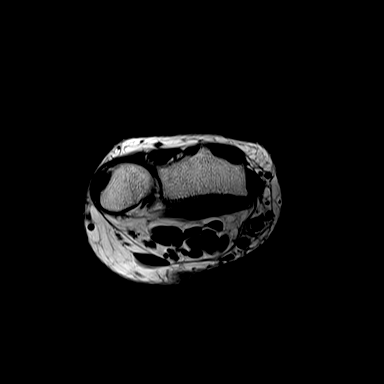
[im 5/20]
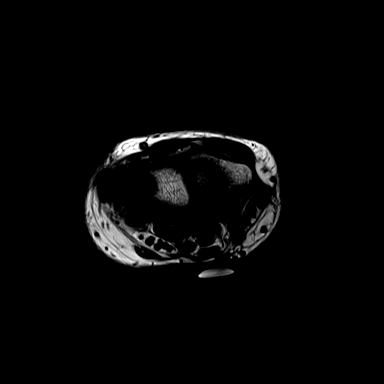
[im 10/20]
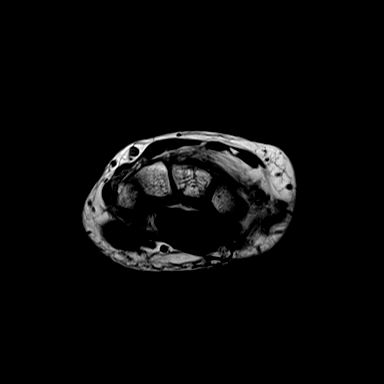
[im 15/20]
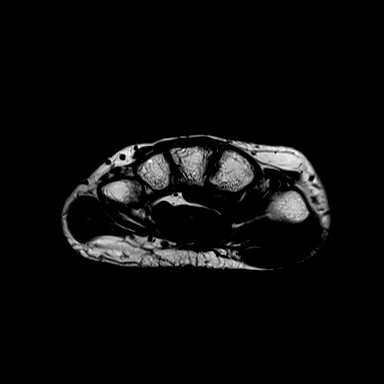
[im 20/20]
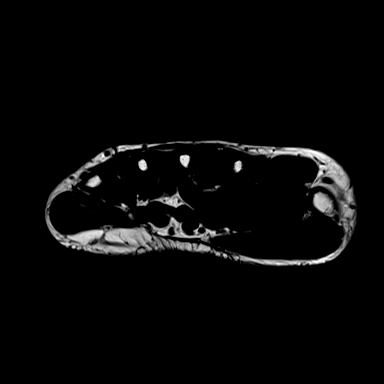

[Series 5: t2_axial_fs · axial · right · 3.0mm · 0.34mm/px · z∈[-14,+41]mm · 6 of 20 slices shown]
[im 1/20]
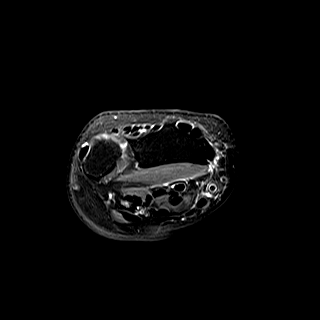
[im 4/20]
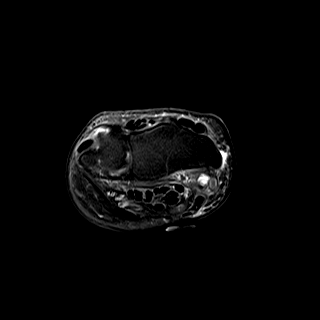
[im 8/20]
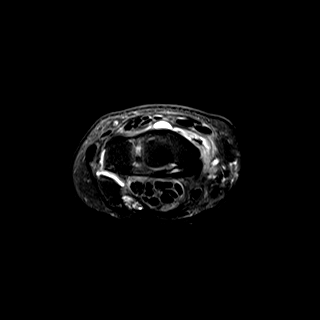
[im 12/20]
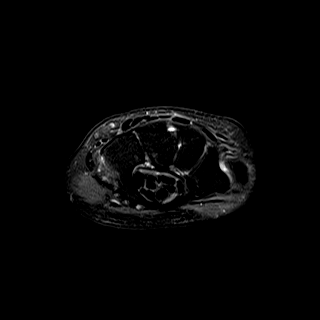
[im 16/20]
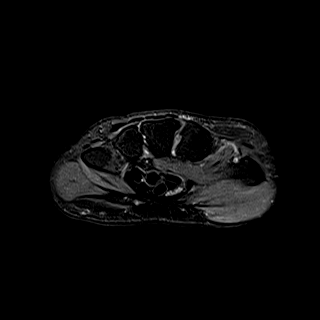
[im 20/20]
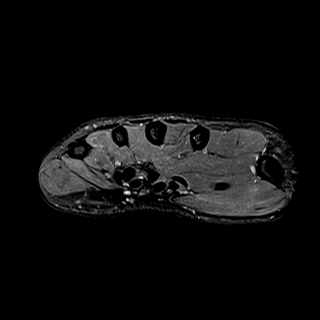

[Series 7: t1_cor · coronal · right · 3.0mm · 0.27mm/px · 4 of 14 slices shown]
[im 1/14]
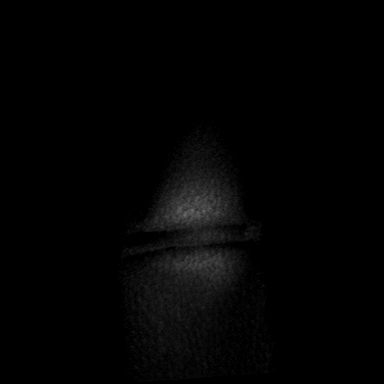
[im 5/14]
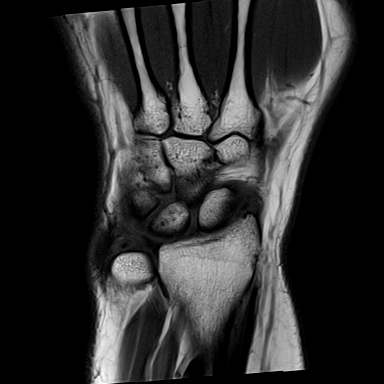
[im 9/14]
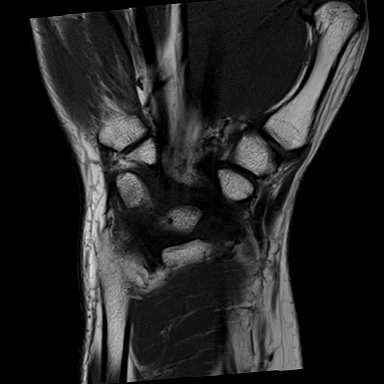
[im 14/14]
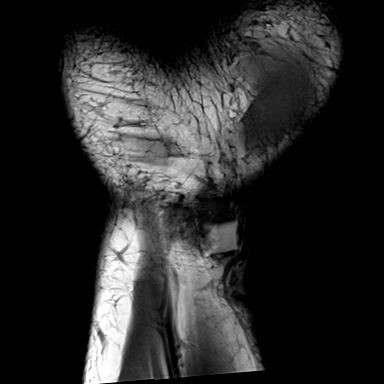

[Series 8: t2_cor_fs · coronal · right · 3.0mm · 0.33mm/px · 4 of 14 slices shown]
[im 1/14]
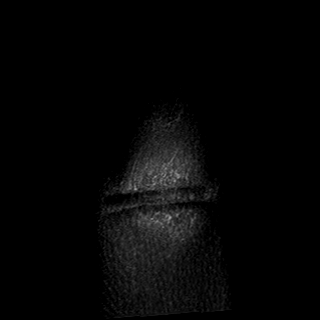
[im 5/14]
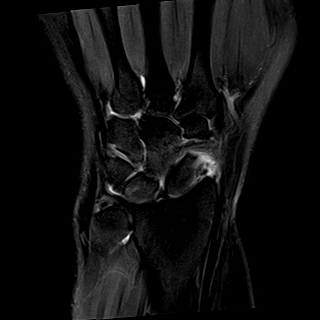
[im 9/14]
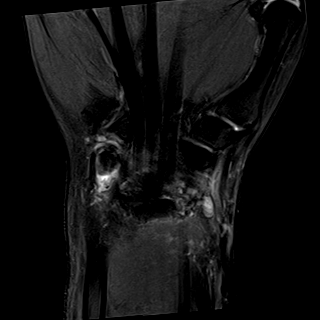
[im 14/14]
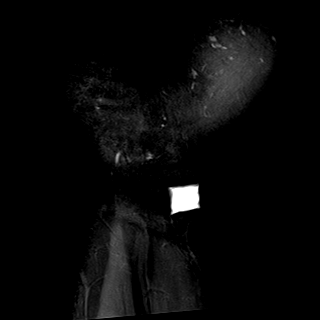

[Series 9: t2_sag_fs · sagittal · right · 3.0mm · 0.39mm/px · 6 of 20 slices shown]
[im 1/20]
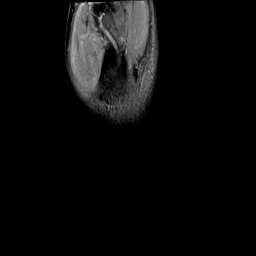
[im 4/20]
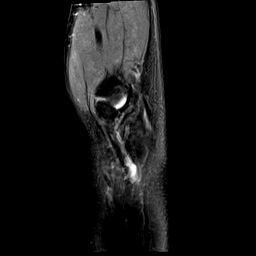
[im 8/20]
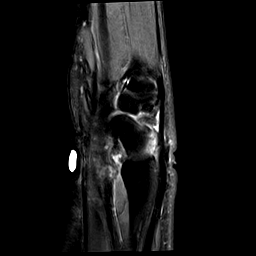
[im 12/20]
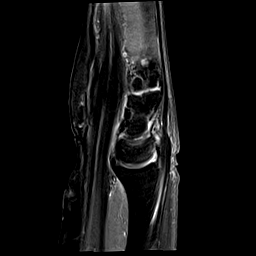
[im 16/20]
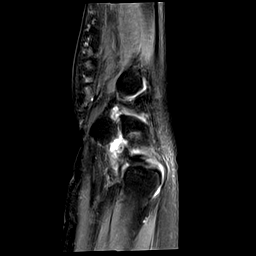
[im 20/20]
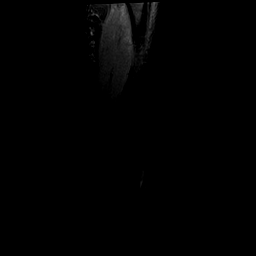

[Series 10: t2_cor_fs_space · coronal · right · 0.9mm · 0.33mm/px · 12 of 44 slices shown]
[im 1/44]
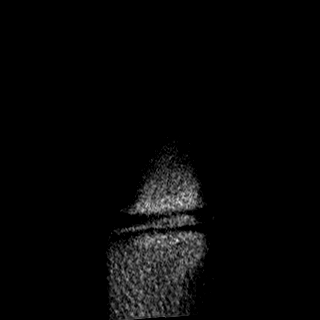
[im 4/44]
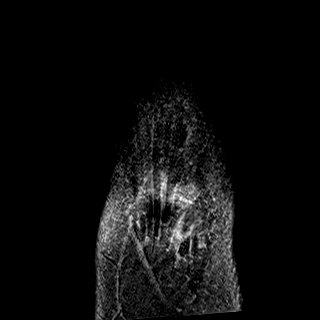
[im 8/44]
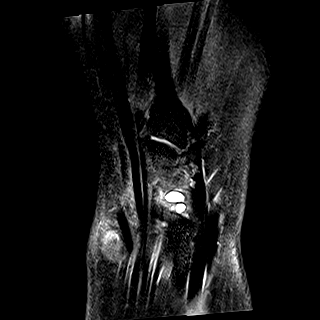
[im 12/44]
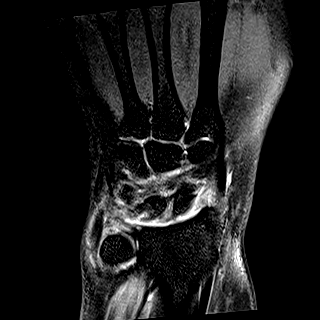
[im 16/44]
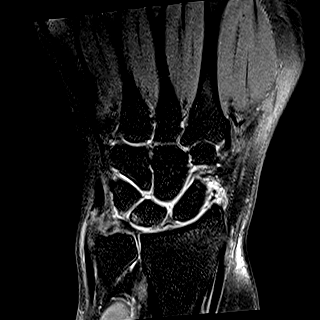
[im 20/44]
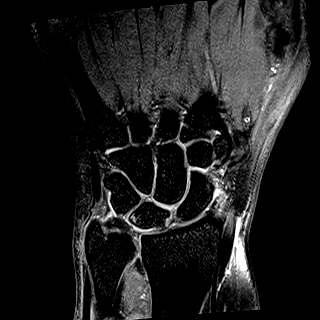
[im 24/44]
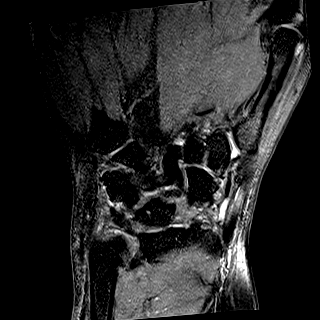
[im 28/44]
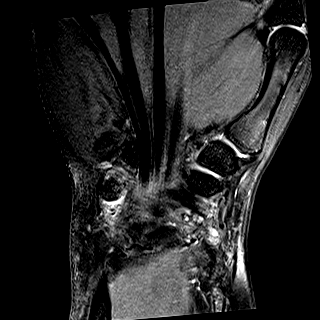
[im 32/44]
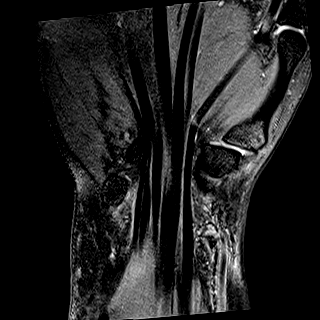
[im 36/44]
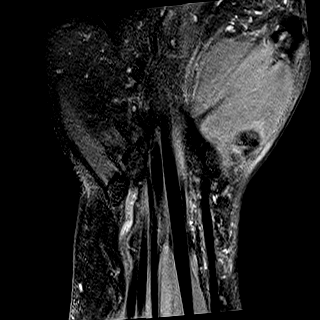
[im 40/44]
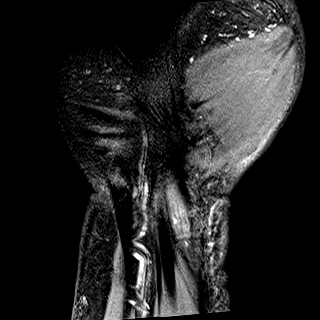
[im 44/44]
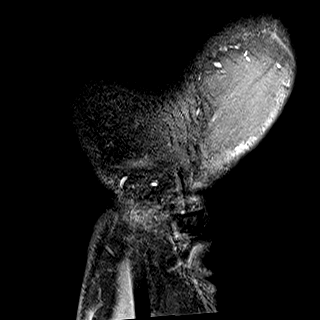

[37 of 40 positions shown; findings below may reference images not displayed]

FINDINGS: Extensor tendons: Mild tendinosis of the extensor pollicis brevis tendon. No tenosynovitis. No rupture. Trace tenosynovitis of the extensor carpi ulnaris.

Flexor tendons:  Intact. No tenosynovitis.

Triangular fibrocartilage complex: Low-grade partial-thickness tear at the radial aspect of the TFC. Degeneration of the meniscal homolog. UCL is intact.

Scapholunate ligament: Degeneration of the volar and dorsal bands. No rupture/tear.

Lunotriquetral ligament: Intact.

Carpal bones: Subchondral marrow edema at the proximal lunate.

Cartilage: High-grade chondral loss at the proximal lunate.

Carpal tunnel: Evidence of prior carpal tunnel release. Mild high T2 signal within the median nerve within the carpal tunnel, which is volarly positioned near the skin surface, at the level of the radiocarpal joint.

Other: Acute high-grade partial-thickness tear of the radial collateral ligament of the wrist. Multiloculated 9 x 9 x 7 mm ganglion cyst along the volar aspect of the proximal pole scaphoid.
IMPRESSION: 1. Acute high-grade partial-thickness tear of the radial collateral ligament of the wrist.

2. Status post carpal tunnel release with volar positioning of the median nerve near the skin surface, at the level of the radiocarpal joint.

3. Multiloculated 9 x 9 x 7 mm ganglion cyst along the volar aspect of the proximal pole scaphoid.

4. Mild ulnocarpal joint DJD.

## 2021-02-08 IMAGING — CT CT BRAIN WO CONTRAST
3 of 4 series · 15 of 47 positions shown, 18 images · non-contrast
Comparison: None

HISTORY: 55 year-old female with worsening migraines.
TECHNIQUE: CT of the head was obtained without contrast. 

Dose reduction technique used: Automated exposure control and adjustment of the mA and/or kV according to patient size. CT count in previous 12-months: 0

[Series 2: brain · axial · 0.36mm/px · z∈[-602,-465]mm · 9 of 54 slices shown, 12 images]
[im 4/54  brain]
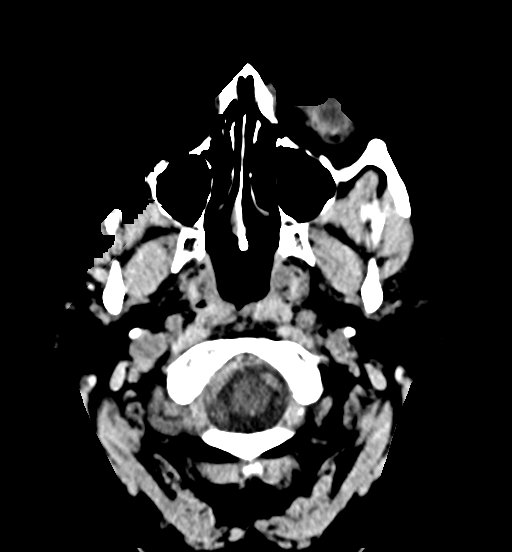
[im 4/54  bone]
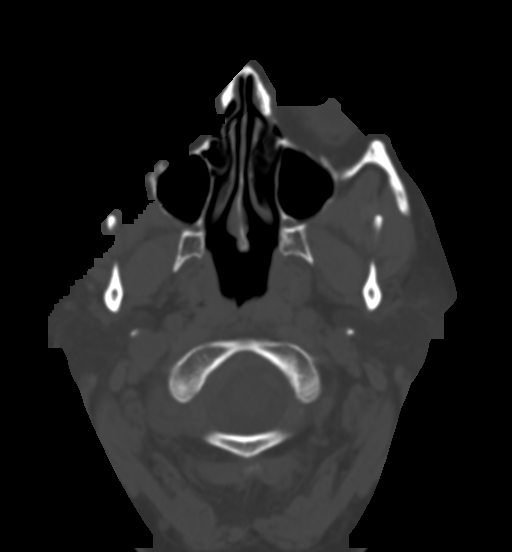
[im 12/54  brain]
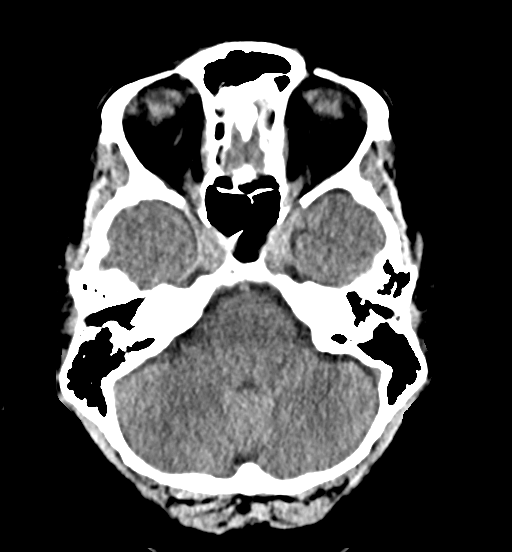
[im 16/54  brain]
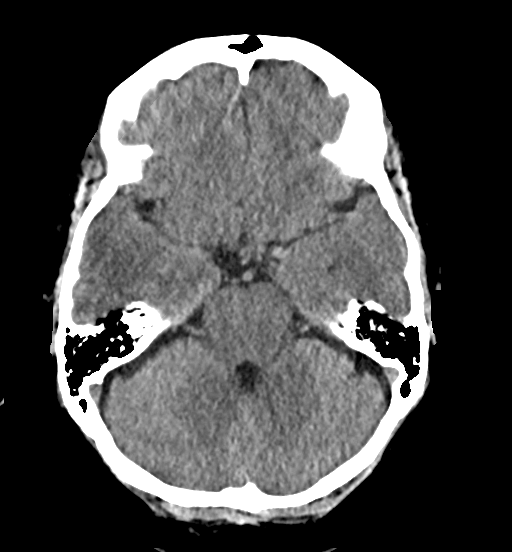
[im 23/54  brain]
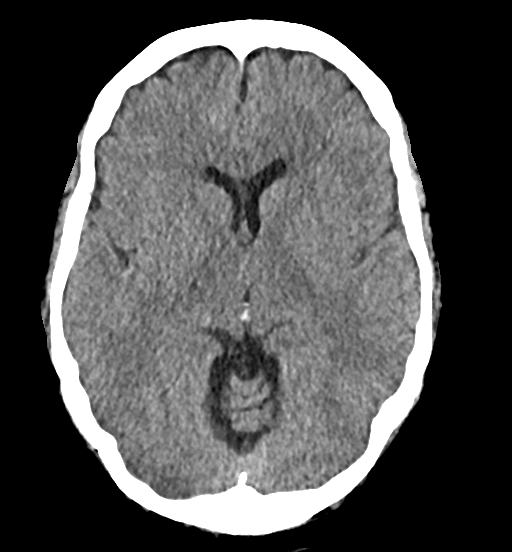
[im 27/54  brain]
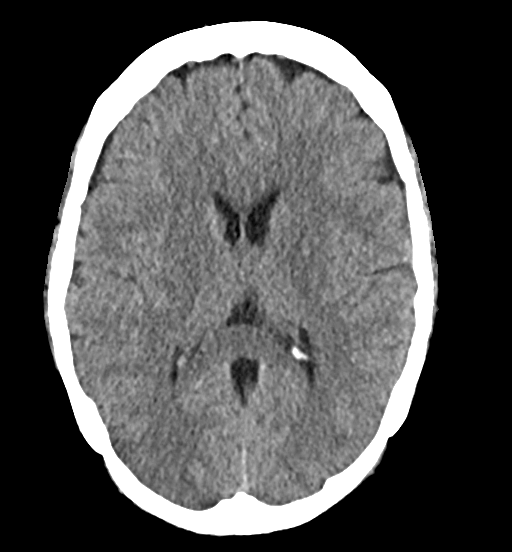
[im 27/54  bone]
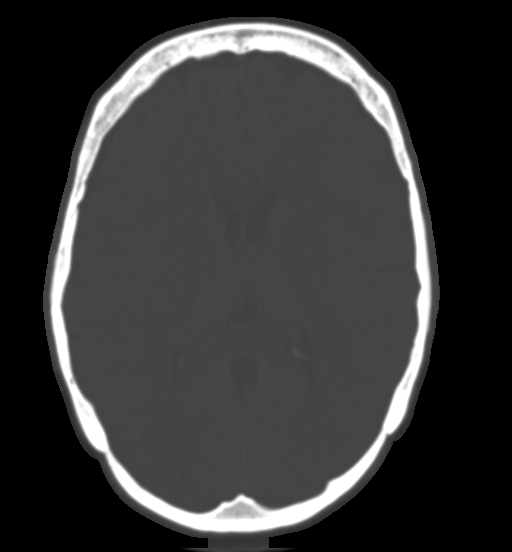
[im 31/54  brain]
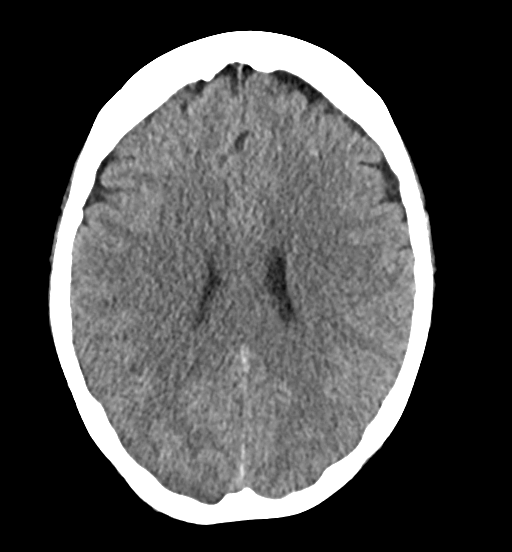
[im 38/54  brain]
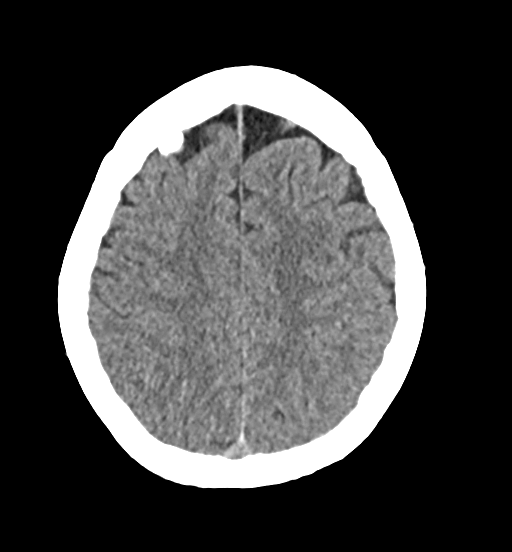
[im 42/54  brain]
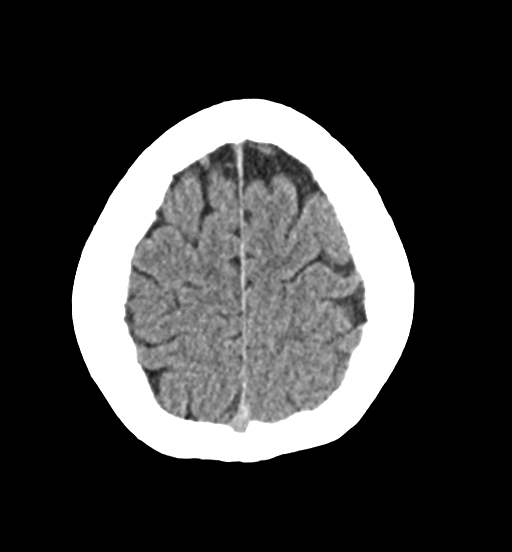
[im 50/54  brain]
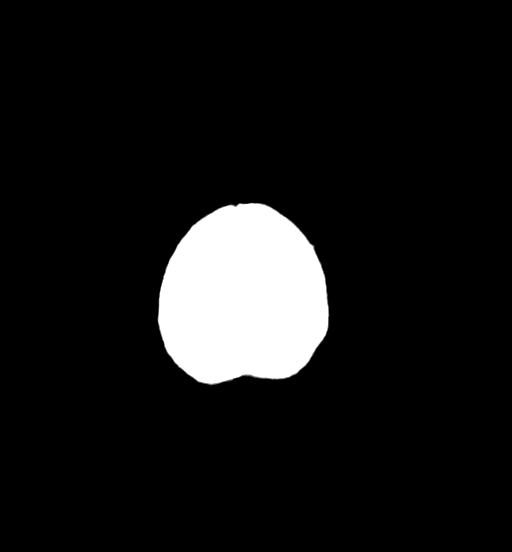
[im 50/54  bone]
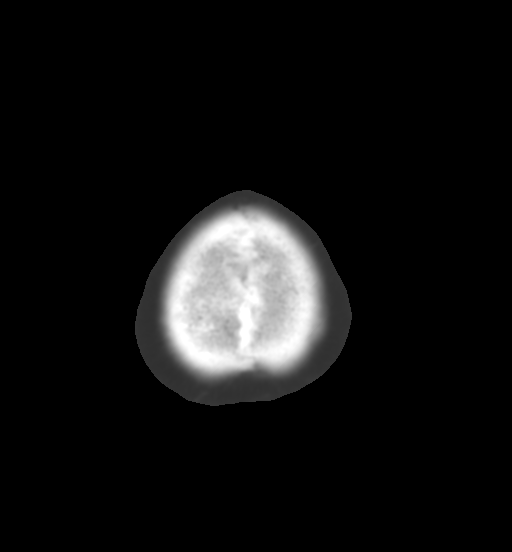

[Series 6: coronal brain · coronal · 0.32mm/px · 3 of 64 slices shown]
[im 22/64  brain]
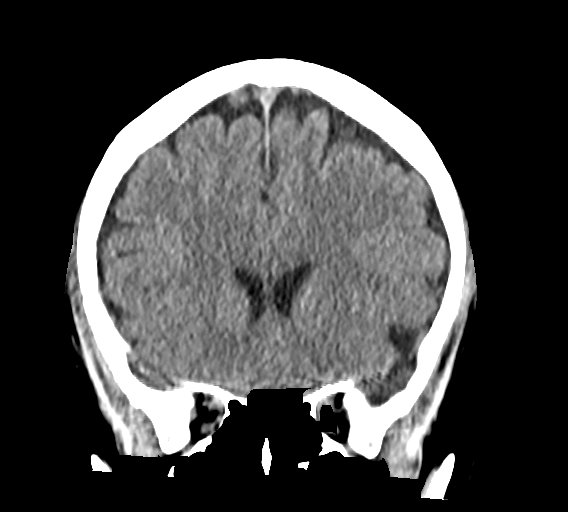
[im 29/64  brain]
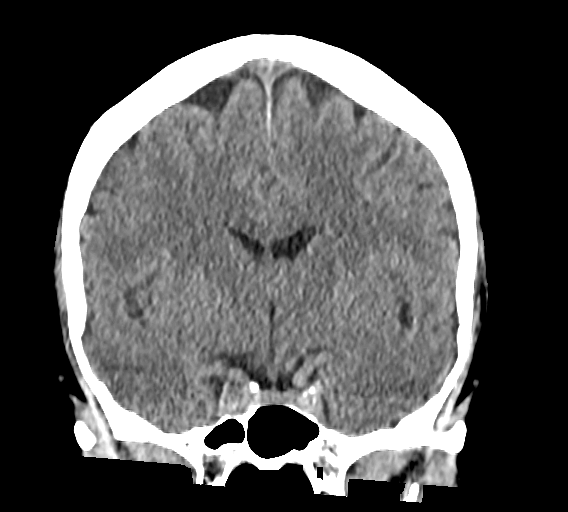
[im 36/64  brain]
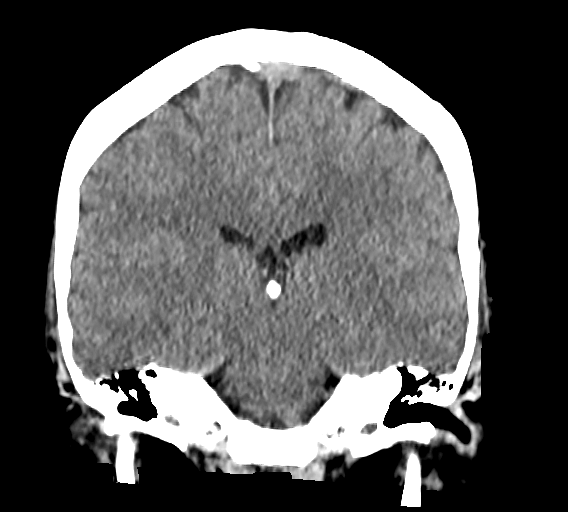

[Series 8: sagittal brain · sagittal · 0.32mm/px · 3 of 49 slices shown]
[im 17/49  brain]
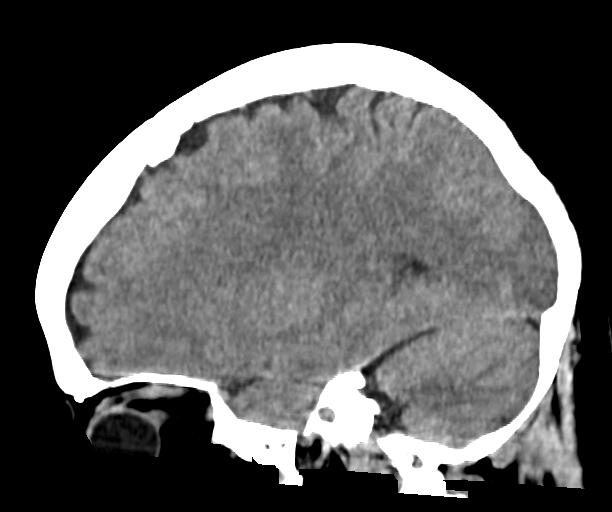
[im 25/49  brain]
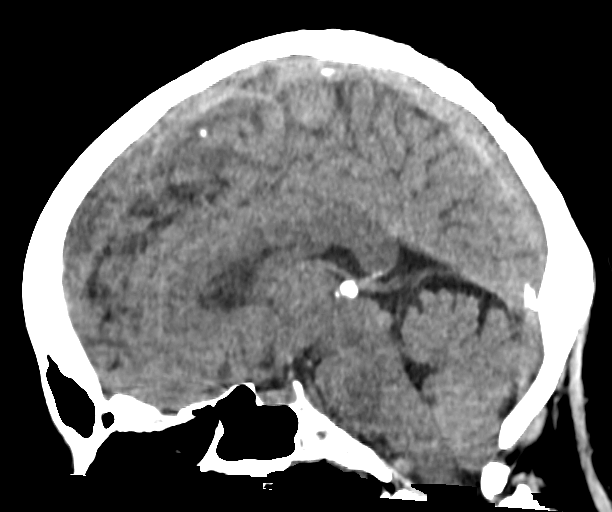
[im 33/49  brain]
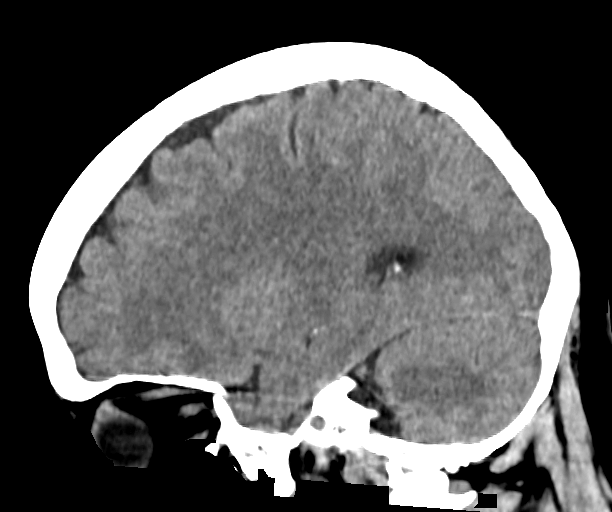

[15 of 47 positions shown; findings below may reference images not displayed]

FINDINGS: BRAIN PARENCHYMA: There is normal differentiation of the gray-white matter.

HEMORRHAGE: No acute intracranial hemorrhagic products.

EXTRA-AXIAL SPACES: No intra- or extra-axial fluid collections. Basilar cisterns are maintained.

VENTRICULAR SYSTEM: No hydrocephalus, midline shift or herniation.

BONES: The calvarium is intact.  Visualized paranasal sinuses and mastoid air cells are clear.
IMPRESSION: No acute intracranial abnormality.

Total radiation dose to patient is CTDIvol 35.50 mGy and DLP 651.00 mGy-cm.

## 2021-03-25 IMAGING — MR MRI BRAIN W/WO CONTRAST
12 series · 48 of 48 positions shown · IV contrast (prohance)
Comparison: CT brain 02/08/21

INDICATION: 55-year-old Female with migraines. History of breast cancer in 5776.
TECHNIQUE: Multiplanar, multisequence MRI of the brain was performed without and with intravenous contrast. The patient received an intravenous dose of 15 mL ProHance.

[Series 1: bSSFP · axial · 8.0mm · 1.17mm/px · 1 of 19 slices shown]
[im 1/19]
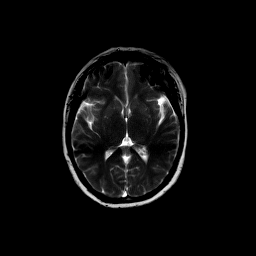

[Series 2: mprage_axial_pre · axial · 1.0mm · 0.90mm/px · z∈[-129,+46]mm · 7 of 176 slices shown]
[im 1/176]
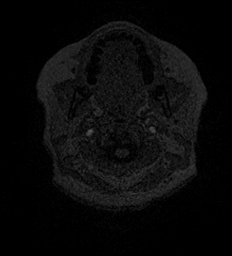
[im 30/176]
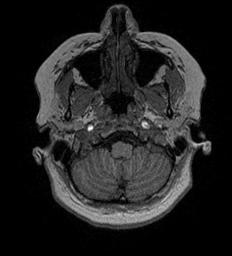
[im 59/176]
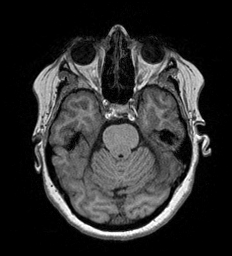
[im 88/176]
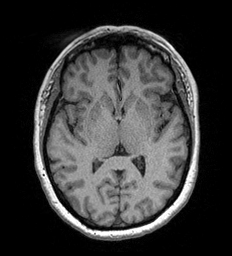
[im 117/176]
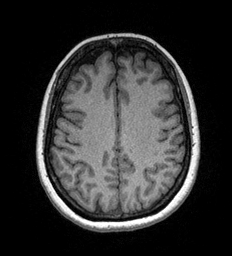
[im 146/176]
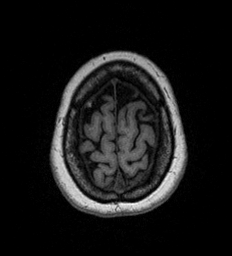
[im 176/176]
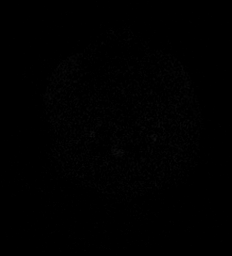

[Series 3: flair_axial_fs · axial · 5.0mm · 0.41mm/px · 1 of 25 slices shown]
[im 1/25]
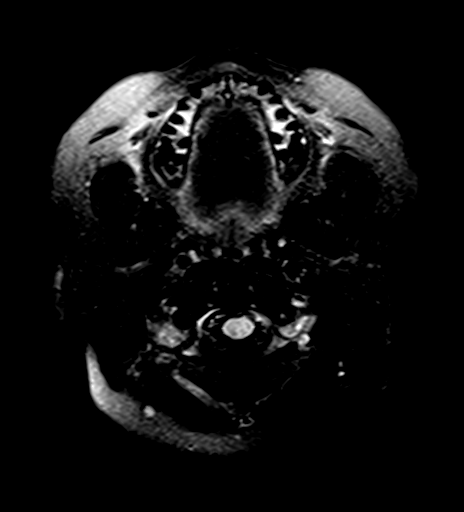

[Series 4: t2_axial · axial · 5.0mm · 0.66mm/px · 1 of 25 slices shown]
[im 1/25]
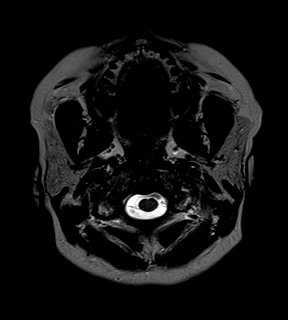

[Series 5: mprage_sag_recon_pre · sagittal · 1.0mm · 0.90mm/px · 7 of 172 slices shown]
[im 1/172]
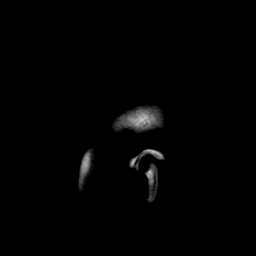
[im 29/172]
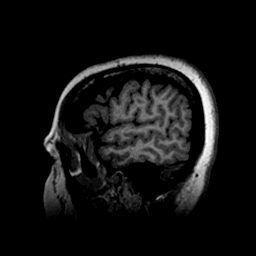
[im 58/172]
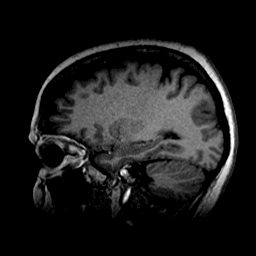
[im 86/172]
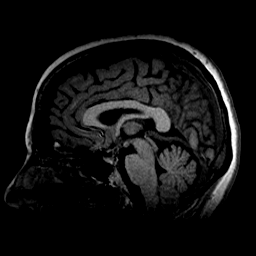
[im 115/172]
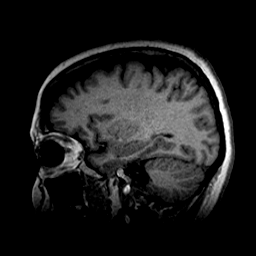
[im 143/172]
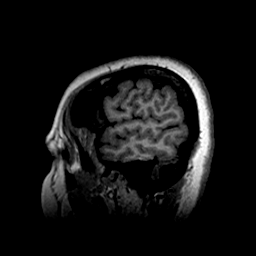
[im 172/172]
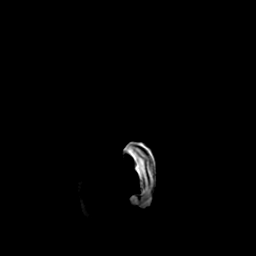

[Series 6: mprage_cor_recon_pre · coronal · 1.0mm · 0.75mm/px · 7 of 180 slices shown]
[im 1/180]
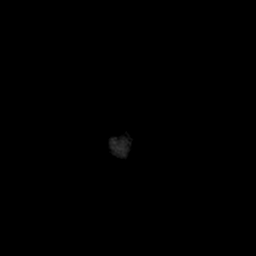
[im 30/180]
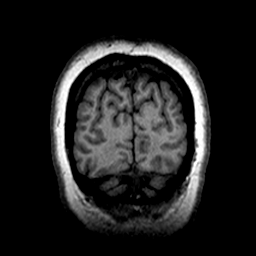
[im 60/180]
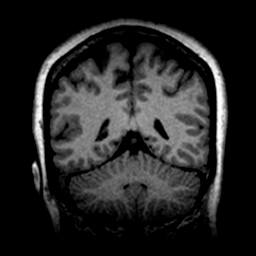
[im 90/180]
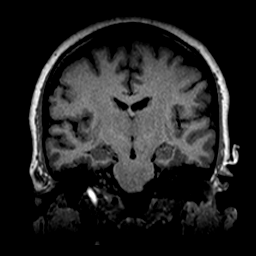
[im 120/180]
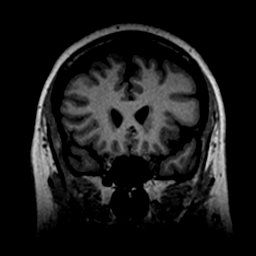
[im 150/180]
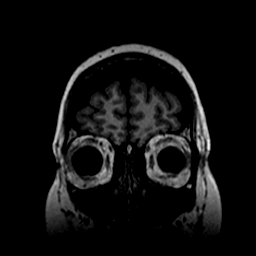
[im 180/180]
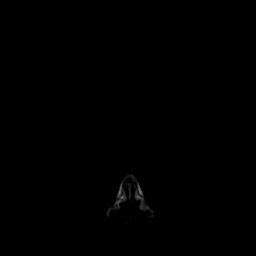

[Series 7: DWI · axial · 5.0mm · 1.23mm/px · 1 of 24 slices shown (1 of 2)]
[im 1/24]
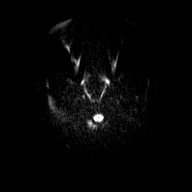

[Series 8: DWI · axial · 5.0mm · 1.23mm/px · 1 of 25 slices shown (2 of 2)]
[im 1/25]
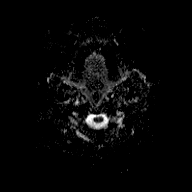

[Series 9: flash_axial · axial · 5.0mm · 0.41mm/px · 1 of 25 slices shown]
[im 1/25]
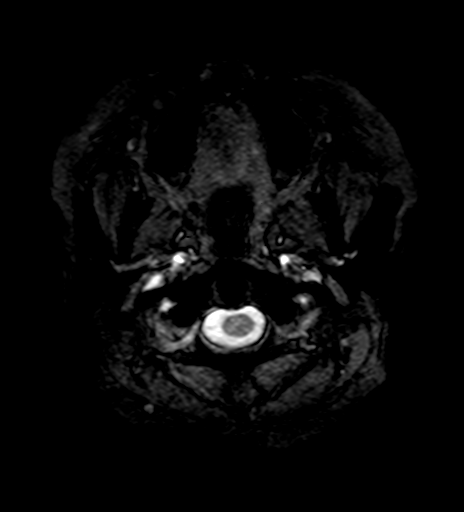

[Series 10: mprage_axial_+c · axial · 1.0mm · 0.90mm/px · z∈[-129,+46]mm · 7 of 176 slices shown]
[im 1/176]
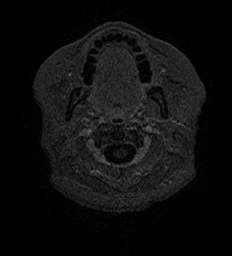
[im 30/176]
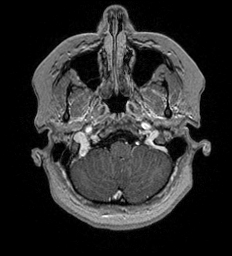
[im 59/176]
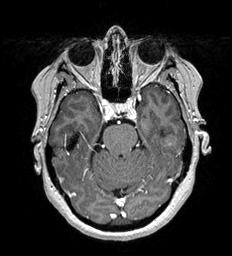
[im 88/176]
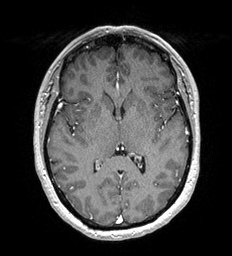
[im 117/176]
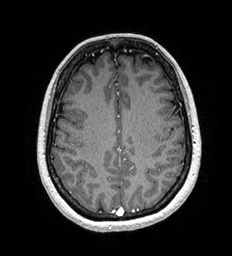
[im 146/176]
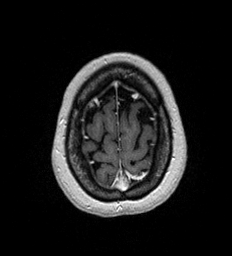
[im 176/176]
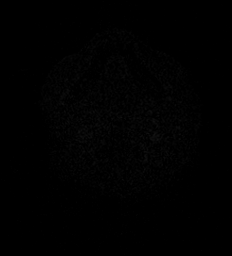

[Series 11: mprage_sag_recon_+c · sagittal · 1.0mm · 0.90mm/px · 7 of 172 slices shown]
[im 1/172]
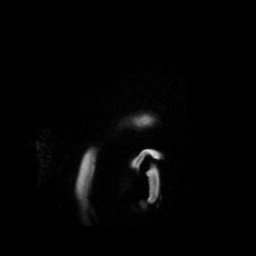
[im 29/172]
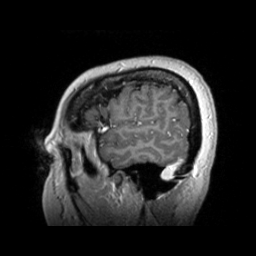
[im 58/172]
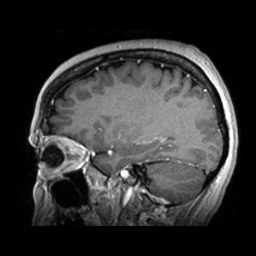
[im 86/172]
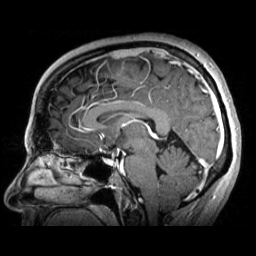
[im 115/172]
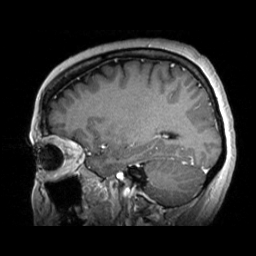
[im 143/172]
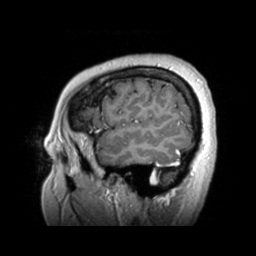
[im 172/172]
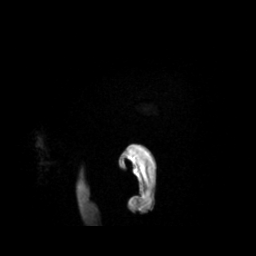

[Series 12: mprage_cor_recon_+c · coronal · 1.0mm · 0.75mm/px · 7 of 180 slices shown]
[im 1/180]
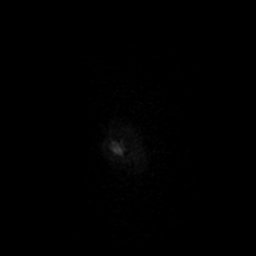
[im 30/180]
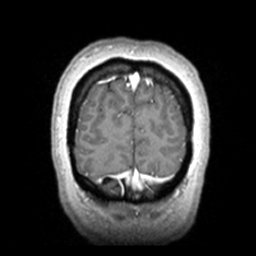
[im 60/180]
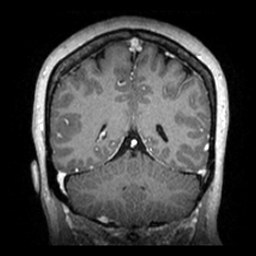
[im 90/180]
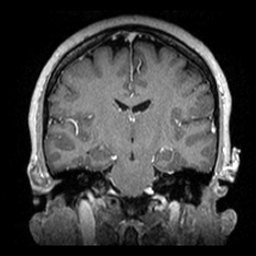
[im 120/180]
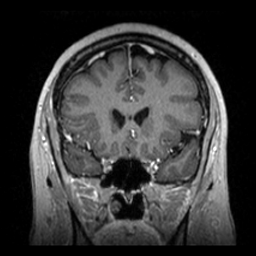
[im 150/180]
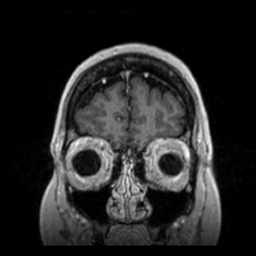
[im 180/180]
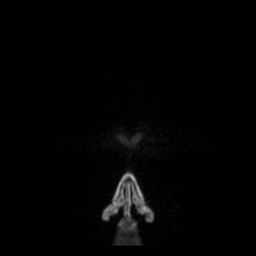

[48 of 48 positions shown; findings below may reference images not displayed]

FINDINGS: Brain parenchyma: No acute infarct or hemorrhage. No significant parenchymal abnormality. Preserved vascular flow voids.

Ventricles: No midline shift, herniation or hydrocephalus.

Extra-axial spaces: No extra-axial fluid collection.

Extracranial structures: Paranasal sinuses, mastoid air cells and middle ear cavities without significant disease. Marrow signal normal. Bilateral ocular lens replacement. Soft tissues normal.

Contrast enhanced views are negative for pathologic enhancement.
IMPRESSION: No abnormality to explain patient's symptoms.

## 2021-08-05 IMAGING — MR MRA HEAD WO/W CONTRAST
5 series · 30 of 48 positions shown · IV contrast (20cc prohance)
Comparison: none

HISTORY: 55-year-old female. Chronic right-sided headache.
TECHNIQUE: Magnetic resonance angiography of the intracranial vasculature included a noncontrast time-of-flight. Additionally, post contrast MRA images were obtained. Post-processing software generated rotating 3D MIP images.

CONTRAST: 20 cc of ProHance.
COMPARISONS: Brain MRI without and with contrast 03/25/2021

[Series 4: t1_sag · sagittal · 5.0mm · 0.47mm/px · 4 of 23 slices shown]
[im 1/23]
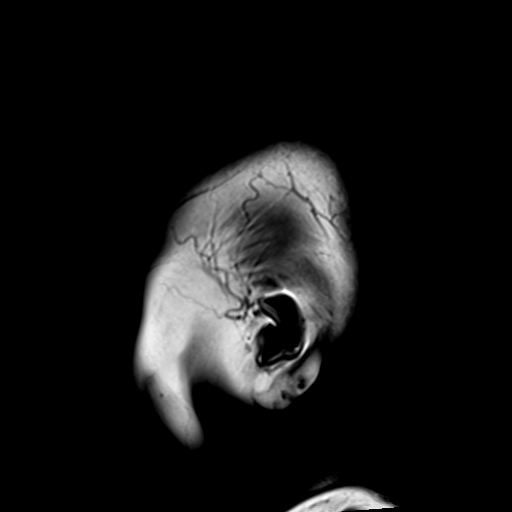
[im 8/23]
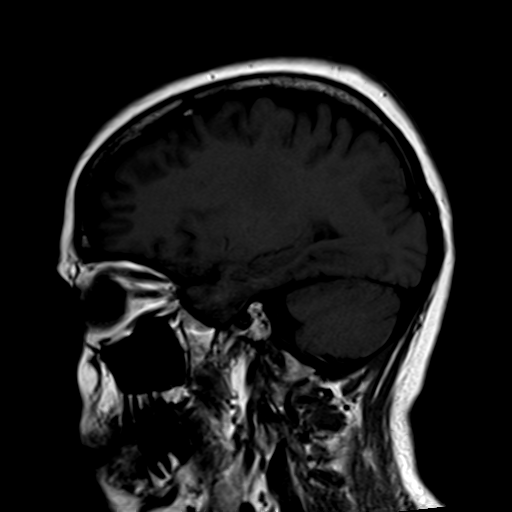
[im 15/23]
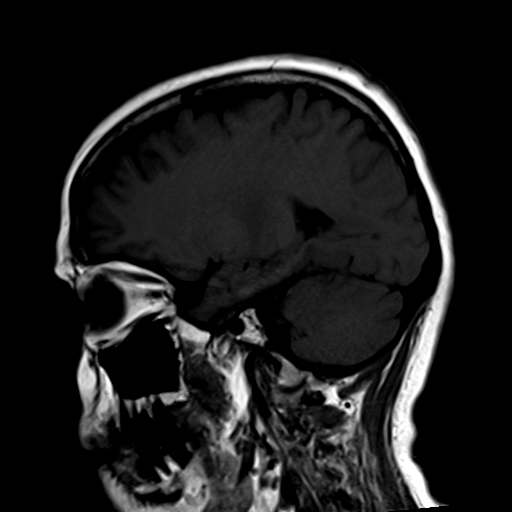
[im 23/23]
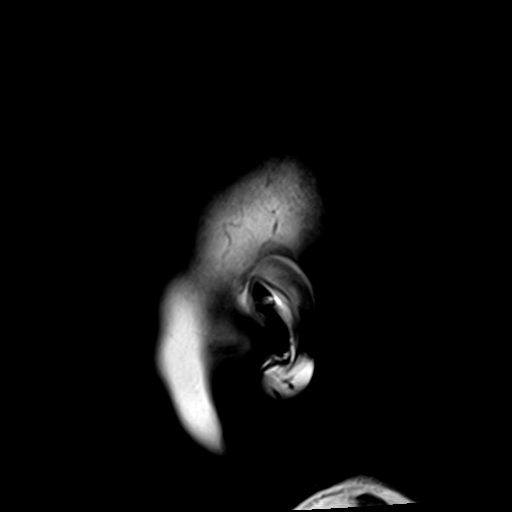

[Series 5: tof_3d_multi-slab · axial · 0.8mm · 0.39mm/px · z∈[-53,+25]mm · 10 of 114 slices shown]
[im 8/114]
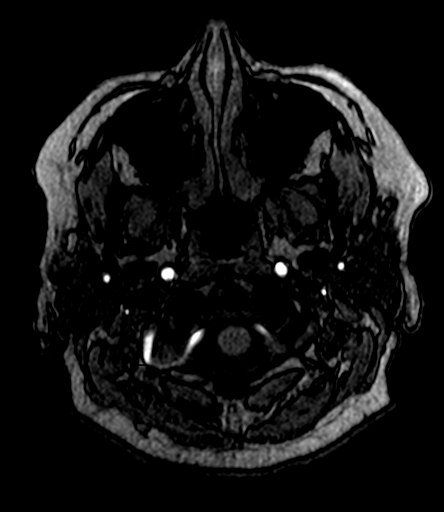
[im 16/114]
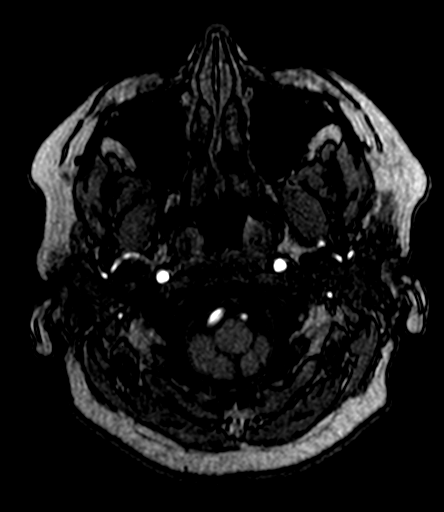
[im 23/114]
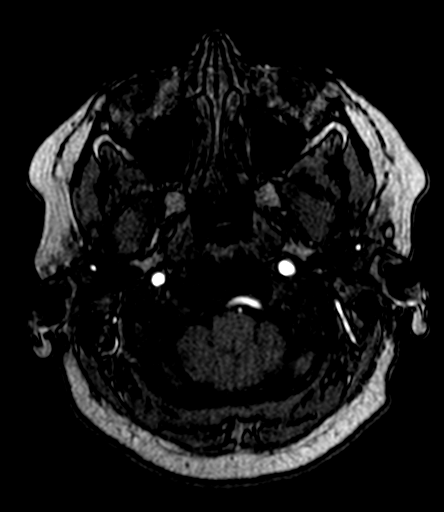
[im 38/114]
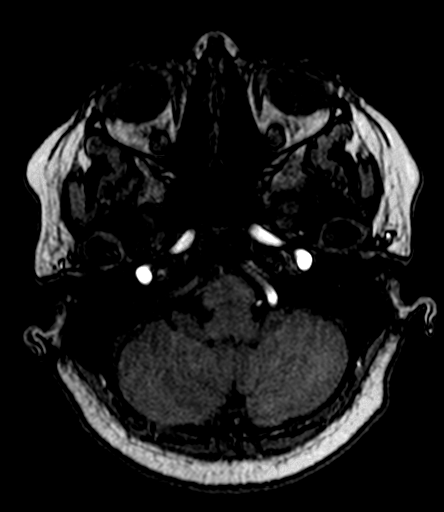
[im 53/114]
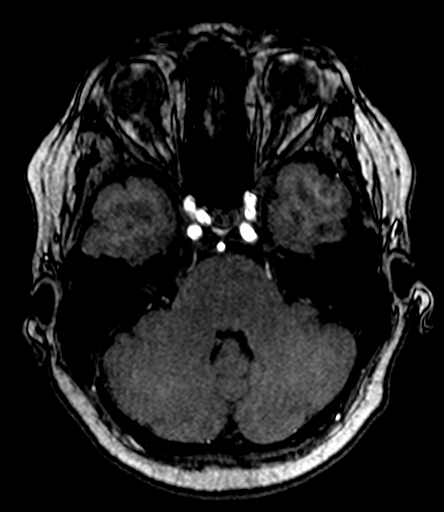
[im 61/114]
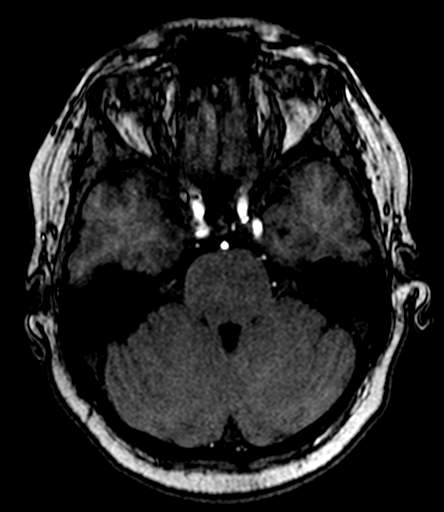
[im 68/114]
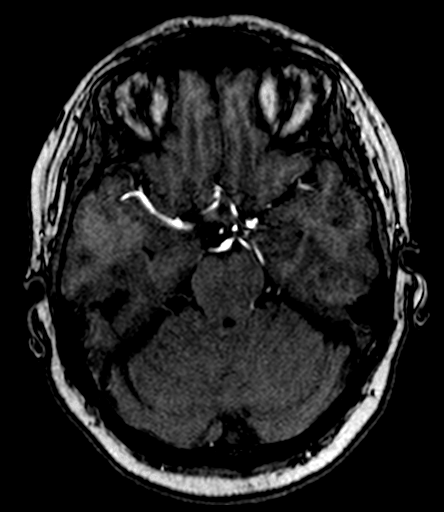
[im 83/114]
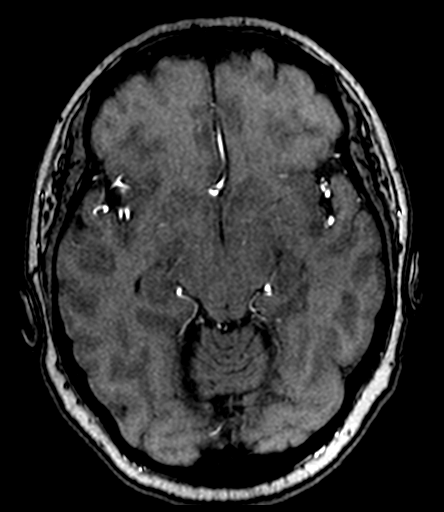
[im 98/114]
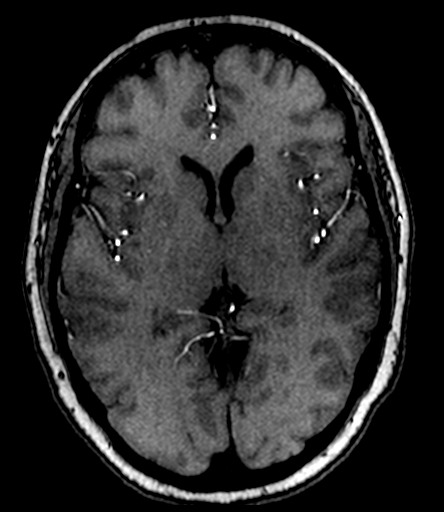
[im 114/114]
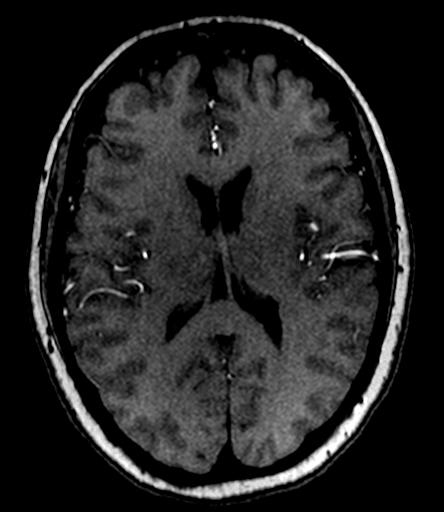

[Series 9: t1_axial_fs · axial · 5.0mm · 0.45mm/px · z∈[-74,+28]mm · 2 of 18 slices shown]
[im 1/18]
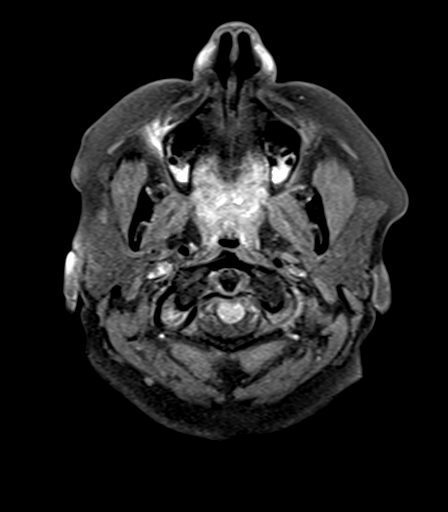
[im 18/18]
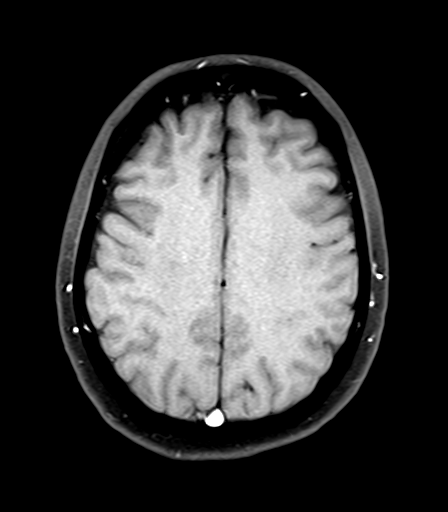

[Series 15: (id) arterial_tt=1.0s_sub · coronal · 1.1mm · 0.75mm/px · 9 of 96 slices shown]
[im 1/96]
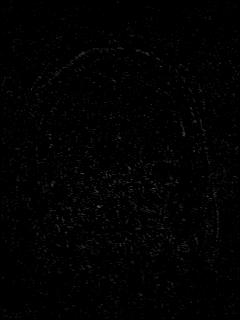
[im 16/96]
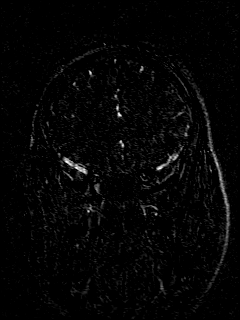
[im 32/96]
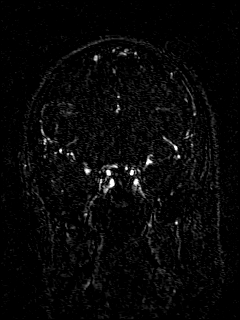
[im 40/96]
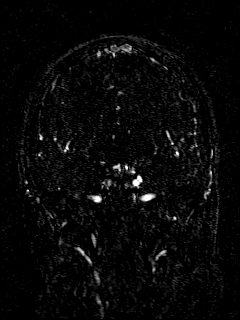
[im 48/96]
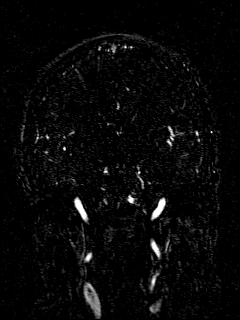
[im 56/96]
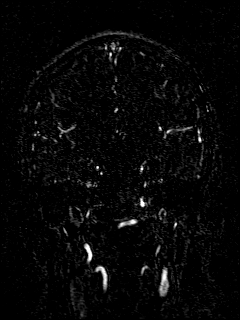
[im 64/96]
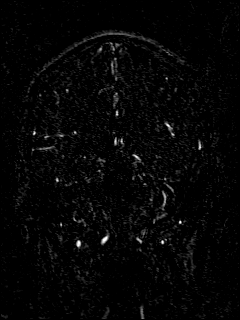
[im 80/96]
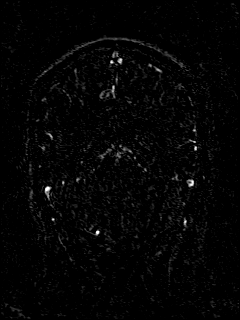
[im 96/96]
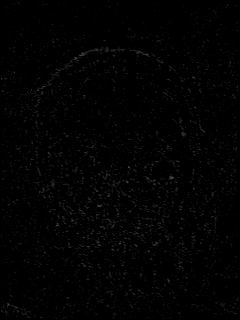

[Series 18: (id) venous_tt=1.0s_sub · coronal · 1.1mm · 0.75mm/px · 5 of 95 slices shown]
[im 1/95]
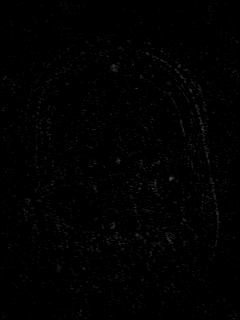
[im 16/95]
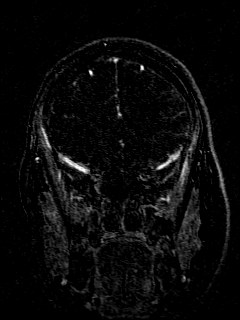
[im 32/95]
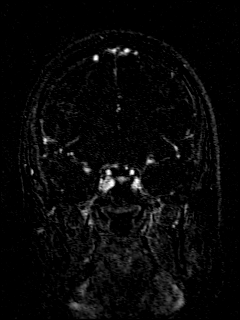
[im 40/95]
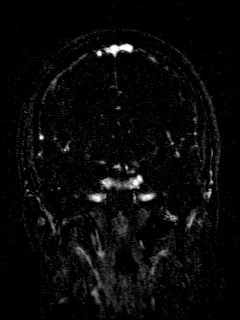
[im 55/95]
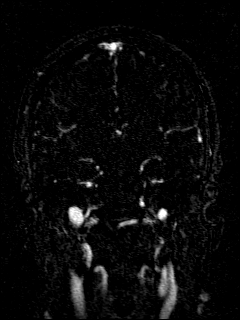

[30 of 48 positions shown; findings below may reference images not displayed]

FINDINGS: Abbreviations:

iICA: Intracranial Carotid Artery.

ACA: Anterior Cerebral Artery.

MCA: Middle Cerebral Artery.

Bridger: Intradural Vertebral Artery.

PCA: Posterior Cerebral Artery.

RIGHT:

iICA: No significant filling defect or hemodynamically significant stenosis.

ACA: No significant filling defect or hemodynamically significant stenosis.

MCA: No significant filling defect or hemodynamically significant stenosis.

Bridger : No significant filling defect or hemodynamically significant stenosis.

PCA: No significant filling defect or hemodynamically significant stenosis.

LEFT:

iICA: No significant filling defect or hemodynamically significant stenosis.

ACA: No significant filling defect or hemodynamically significant stenosis.

MCA: No significant filling defect or hemodynamically significant stenosis.

Bridger : No significant filling defect or hemodynamically significant stenosis.

PCA: No significant filling defect or hemodynamically significant stenosis.

BASILAR ARTERY: No significant filling defect or hemodynamically significant stenosis.

NECK VASCULATURE: No significant filling defect or hemodynamically significant stenosis.

ANEURYSM/VASCULAR MALFORMATION: No aneurysm or vascular malformation in the head.

NONVASCULAR FINDINGS:

No significant abnormalities of the visualized soft tissues and intracranial structures.
IMPRESSION: No significant stenosis, occlusion, aneurysm, or vascular malformation.
